# Patient Record
Sex: Male | Born: 1960 | Race: Black or African American | Hispanic: No | Marital: Married | Smoking: Current every day smoker
Health system: Southern US, Community
[De-identification: ages and names within clinical notes are randomized; demographics above are authoritative.]

## PROBLEM LIST (undated history)

## (undated) DIAGNOSIS — E7800 Pure hypercholesterolemia, unspecified: Secondary | ICD-10-CM

## (undated) DIAGNOSIS — M109 Gout, unspecified: Secondary | ICD-10-CM

## (undated) DIAGNOSIS — I10 Essential (primary) hypertension: Secondary | ICD-10-CM

---

## 1999-12-04 ENCOUNTER — Emergency Department (HOSPITAL_COMMUNITY): Admission: EM | Admit: 1999-12-04 | Discharge: 1999-12-04 | Payer: Self-pay | Admitting: Emergency Medicine

## 2005-03-09 ENCOUNTER — Emergency Department: Payer: Self-pay | Admitting: Emergency Medicine

## 2007-03-05 IMAGING — CR DG FOOT COMPLETE 3+V*L*
1 series · 3 of 3 positions shown · non-contrast
Comparison: none

REASON FOR EXAM: left foot pain
COMMENTS:  LMP: (Male)

[Series 1: view not recorded · 0.17mm/px · 3 of 3 slices shown]
[im 1/3]
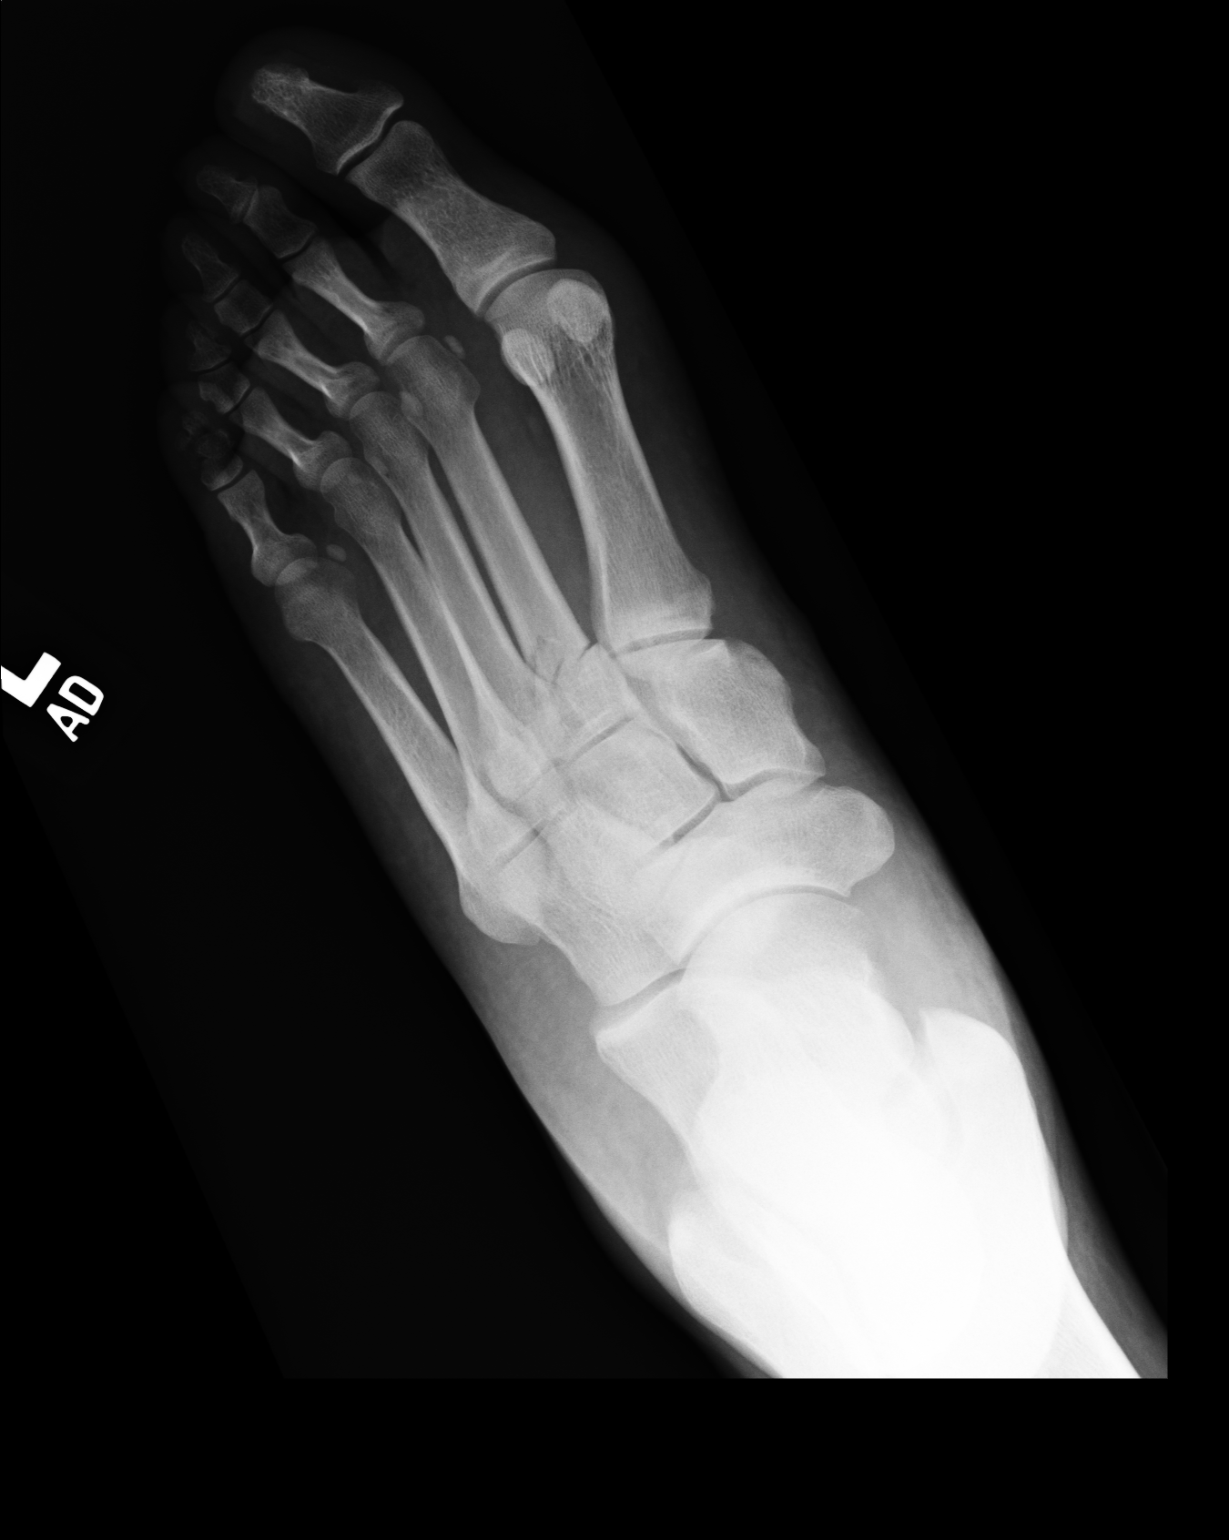
[im 2/3]
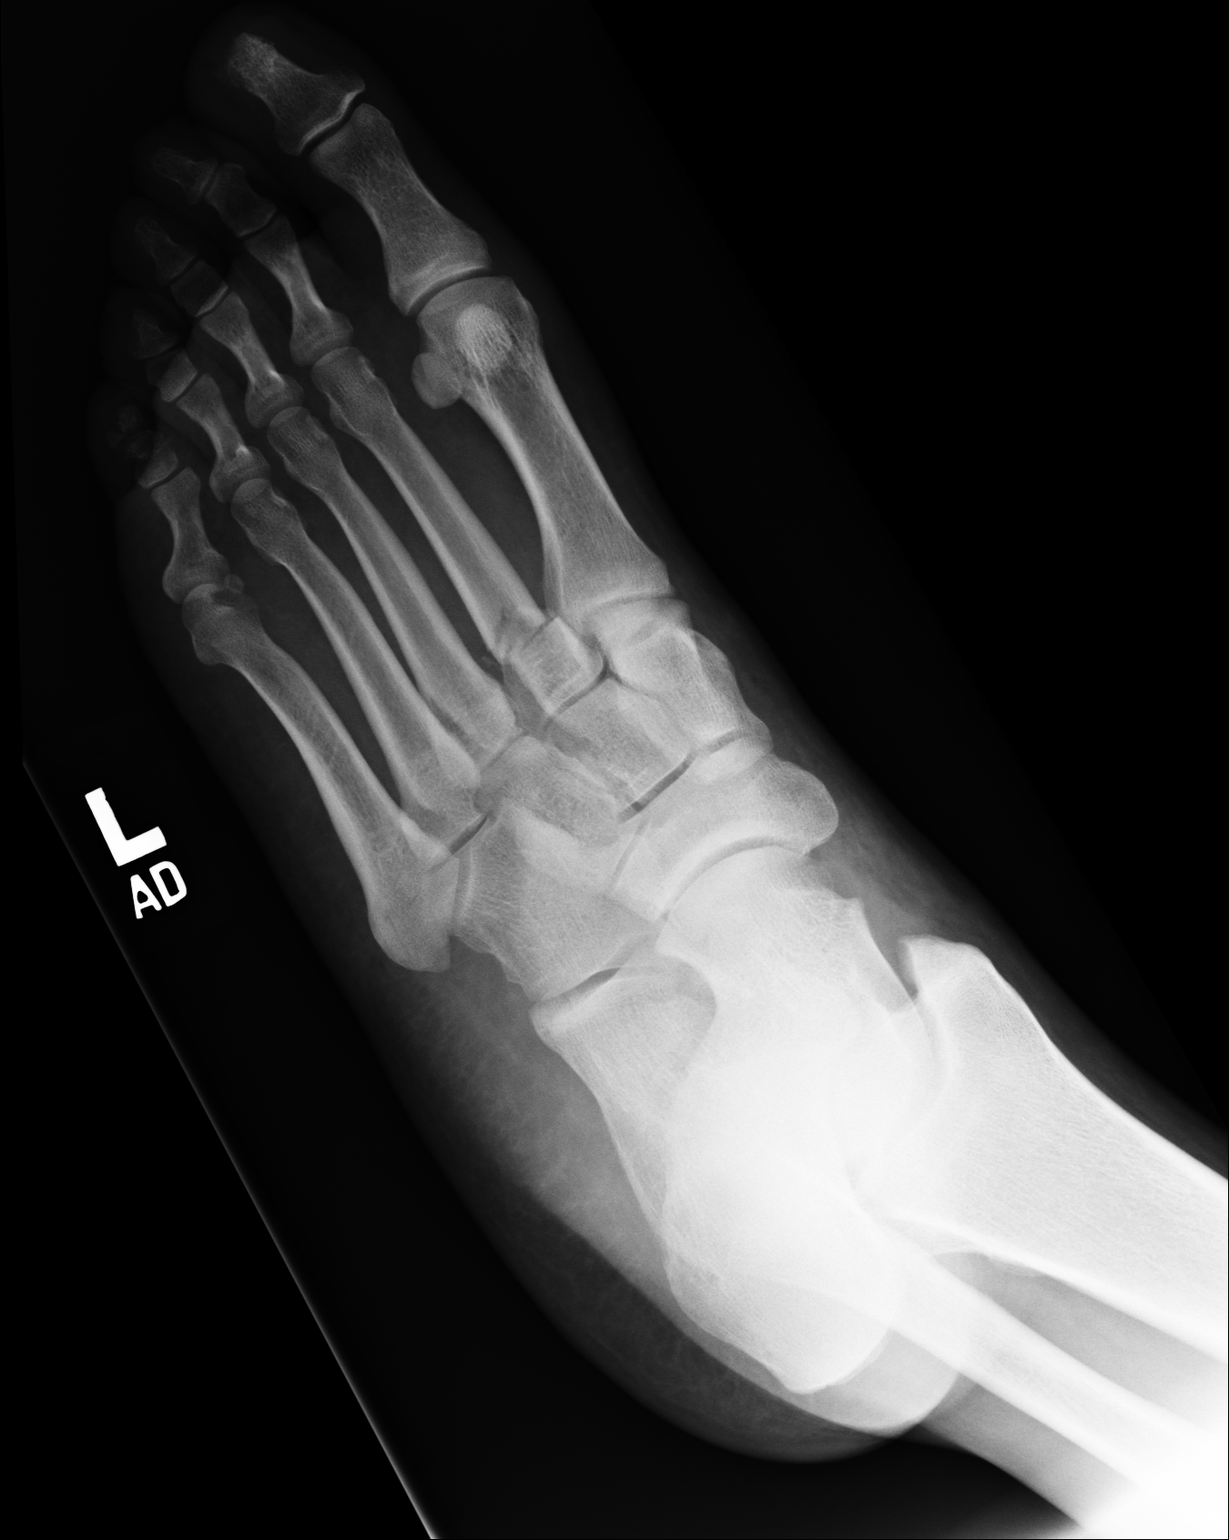
[im 3/3]
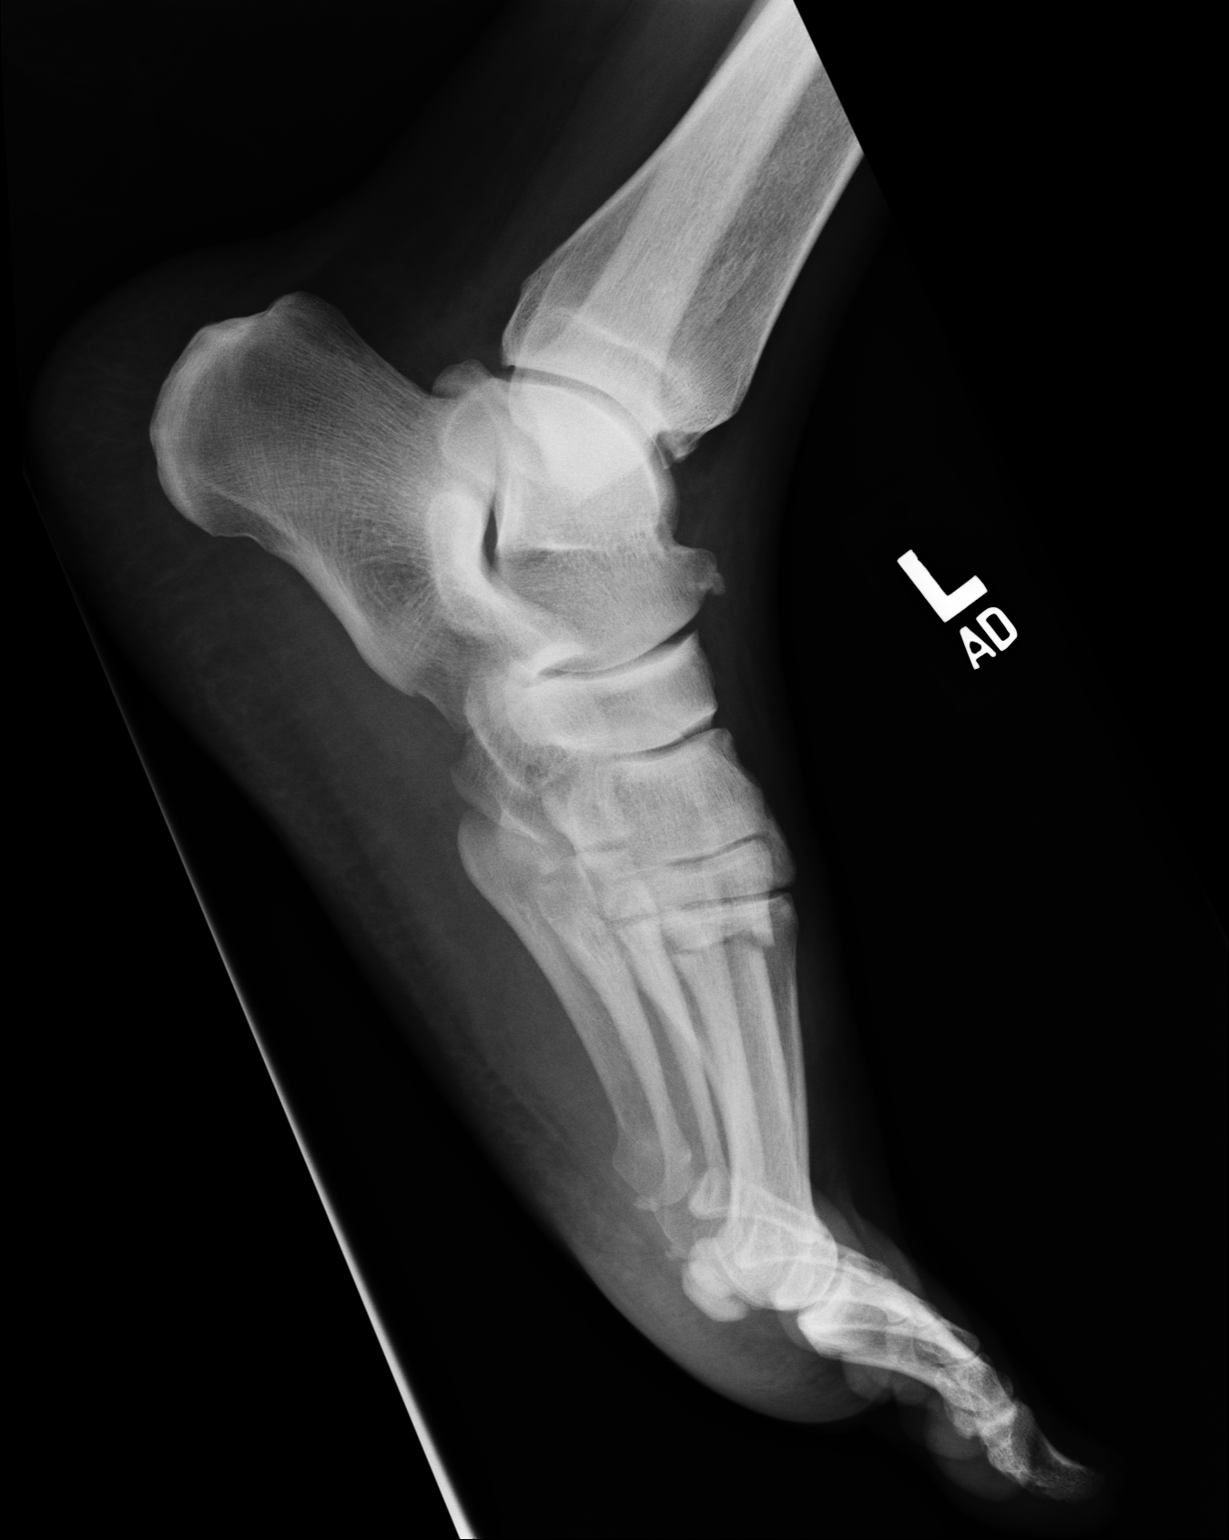

[3 of 3 positions shown; findings below may reference images not displayed]

PROCEDURE:     DXR - DXR FOOT LT COMP W/OBLIQUES  - March 09, 2005  [DATE]

RESULT:            There is a comminuted fracture at the base of the second
metatarsal.  There is also a fracture of the distal portion of the distal
phalanx of the LEFT fifth digit.  Soft tissue swelling is present about the
ankle.
IMPRESSION: 1.     Second metatarsal base fracture.
2.     Fifth distal phalanx fracture.

## 2007-03-05 IMAGING — CR RIGHT FOOT COMPLETE - 3+ VIEW
1 series · 3 of 3 positions shown · non-contrast
Comparison: none

REASON FOR EXAM: right foot pain
COMMENTS:  LMP: (Male)

PROCEDURE:     DXR - DXR FOOT RT COMPLETE W/OBLIQUES  - March 09, 2005  [DATE]
RESULT:       No acute bony or joint abnormality is identified.

[Series 1: view not recorded · 0.17mm/px · 3 of 3 slices shown]
[im 1/3]
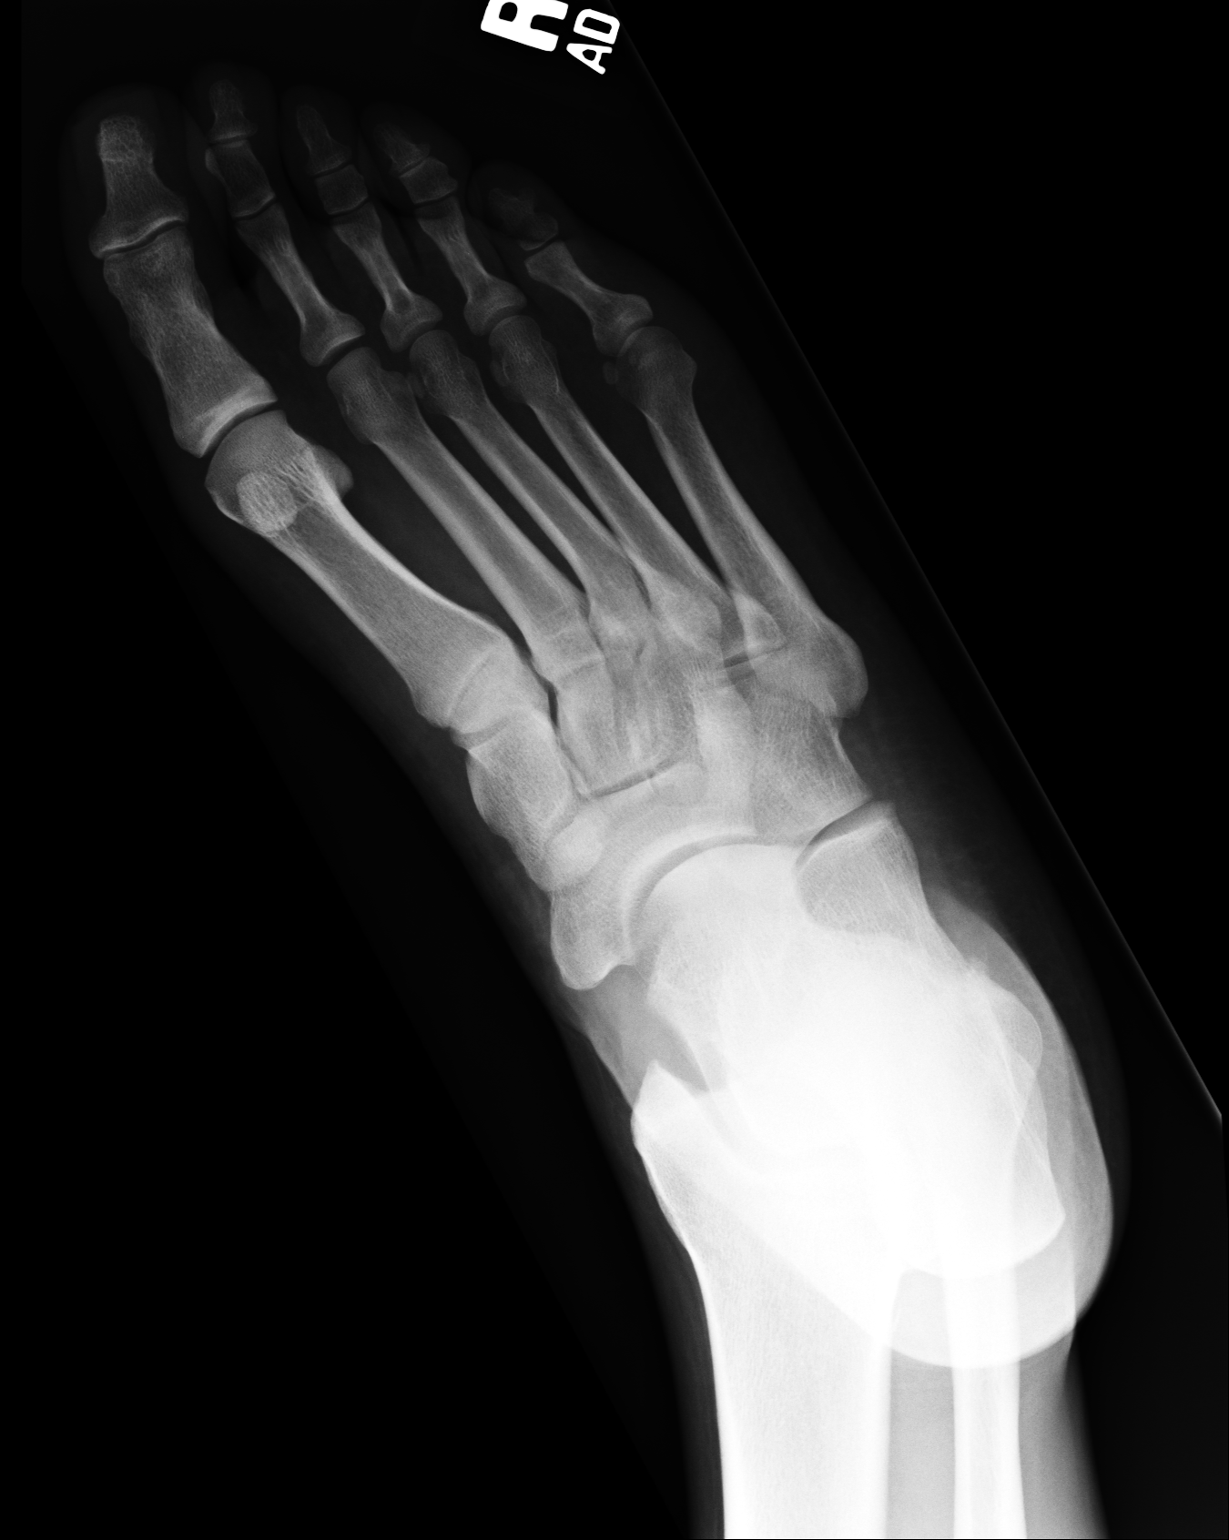
[im 2/3]
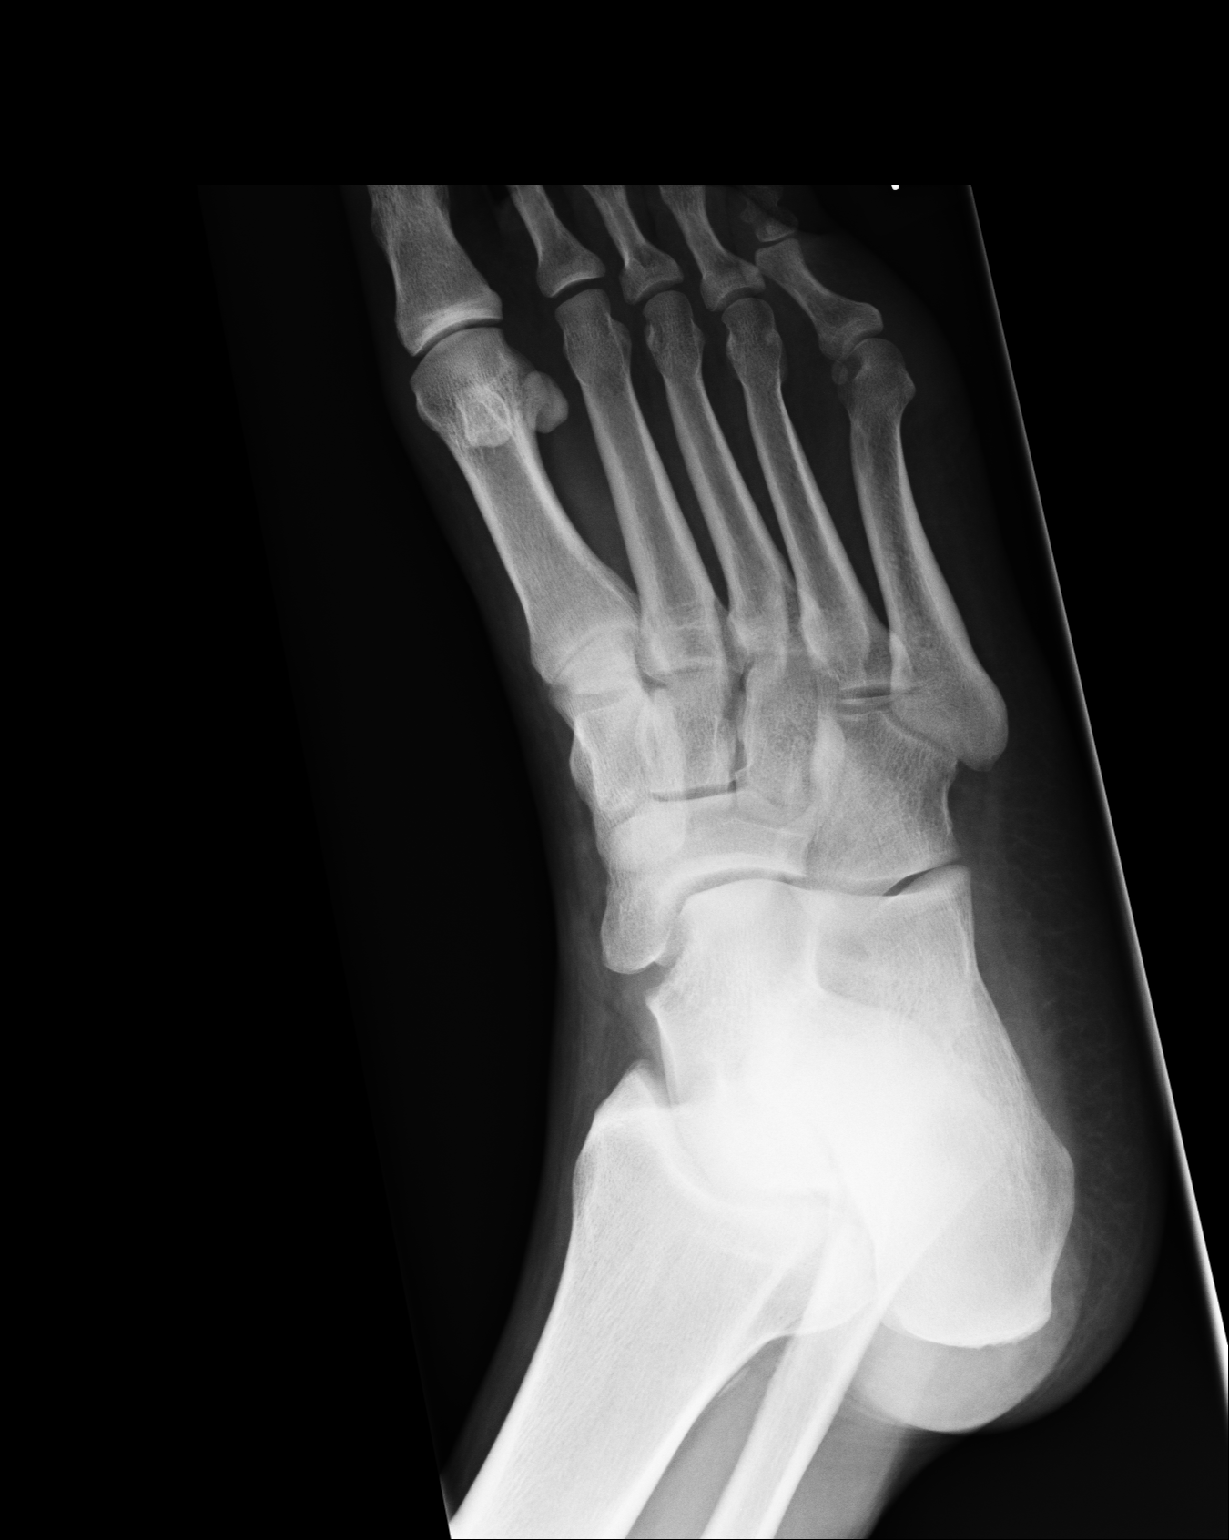
[im 3/3]
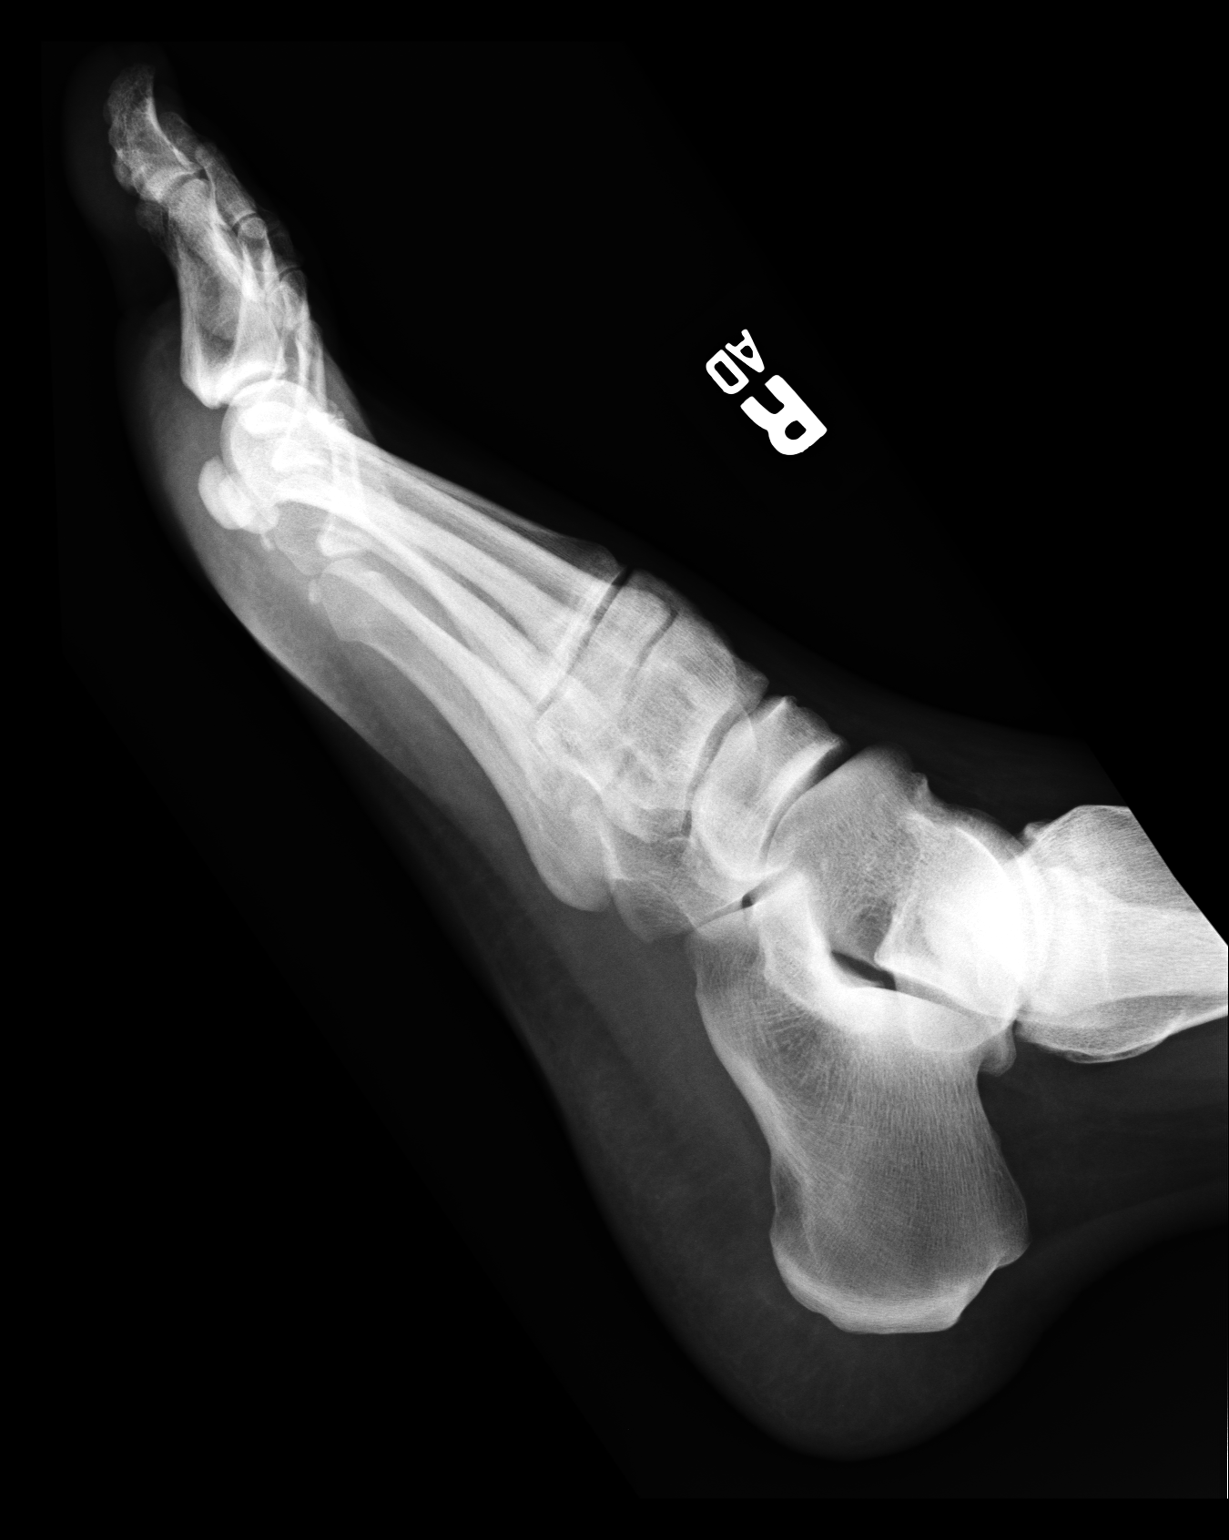

[3 of 3 positions shown; findings below may reference images not displayed]

IMPRESSION: See above.

## 2015-12-18 ENCOUNTER — Encounter: Payer: Self-pay | Admitting: Emergency Medicine

## 2015-12-18 ENCOUNTER — Emergency Department
Admission: EM | Admit: 2015-12-18 | Discharge: 2015-12-18 | Disposition: A | Discharge status: Home / Self Care | Payer: BLUE CROSS/BLUE SHIELD | Attending: Emergency Medicine | Admitting: Emergency Medicine

## 2015-12-18 DIAGNOSIS — E7800 Pure hypercholesterolemia, unspecified: Secondary | ICD-10-CM | POA: Diagnosis not present

## 2015-12-18 DIAGNOSIS — N289 Disorder of kidney and ureter, unspecified: Secondary | ICD-10-CM | POA: Diagnosis not present

## 2015-12-18 DIAGNOSIS — I10 Essential (primary) hypertension: Secondary | ICD-10-CM | POA: Diagnosis not present

## 2015-12-18 DIAGNOSIS — R55 Syncope and collapse: Secondary | ICD-10-CM | POA: Diagnosis present

## 2015-12-18 DIAGNOSIS — E860 Dehydration: Secondary | ICD-10-CM | POA: Insufficient documentation

## 2015-12-18 DIAGNOSIS — F17200 Nicotine dependence, unspecified, uncomplicated: Secondary | ICD-10-CM | POA: Diagnosis not present

## 2015-12-18 HISTORY — DX: Pure hypercholesterolemia, unspecified: E78.00

## 2015-12-18 HISTORY — DX: Essential (primary) hypertension: I10

## 2015-12-18 LAB — URINALYSIS COMPLETE WITH MICROSCOPIC (ARMC ONLY)
Bilirubin Urine: NEGATIVE
Glucose, UA: NEGATIVE mg/dL
Ketones, ur: NEGATIVE mg/dL
Leukocytes, UA: NEGATIVE
Nitrite: NEGATIVE
PROTEIN: 100 mg/dL — AB
SPECIFIC GRAVITY, URINE: 1.02 (ref 1.005–1.030)
pH: 5 (ref 5.0–8.0)

## 2015-12-18 LAB — BASIC METABOLIC PANEL
Anion gap: 10 (ref 5–15)
BUN: 27 mg/dL — AB (ref 6–20)
CALCIUM: 9.2 mg/dL (ref 8.9–10.3)
CO2: 25 mmol/L (ref 22–32)
CREATININE: 1.95 mg/dL — AB (ref 0.61–1.24)
Chloride: 101 mmol/L (ref 101–111)
GFR calc Af Amer: 43 mL/min — ABNORMAL LOW (ref >60)
GFR, EST NON AFRICAN AMERICAN: 37 mL/min — AB (ref >60)
Glucose, Bld: 74 mg/dL (ref 65–99)
Potassium: 3.8 mmol/L (ref 3.5–5.1)
SODIUM: 136 mmol/L (ref 135–145)

## 2015-12-18 LAB — CBC
HCT: 38 % — ABNORMAL LOW (ref 40.0–52.0)
Hemoglobin: 13 g/dL (ref 13.0–18.0)
MCH: 32.2 pg (ref 26.0–34.0)
MCHC: 34.2 g/dL (ref 32.0–36.0)
MCV: 94.3 fL (ref 80.0–100.0)
PLATELETS: 334 10*3/uL (ref 150–440)
RBC: 4.03 MIL/uL — ABNORMAL LOW (ref 4.40–5.90)
RDW: 13.9 % (ref 11.5–14.5)
WBC: 9.1 10*3/uL (ref 3.8–10.6)

## 2015-12-18 LAB — TROPONIN I: TROPONIN I: 0.03 ng/mL (ref <0.031)

## 2015-12-18 MED ORDER — ONDANSETRON HCL 4 MG/2ML IJ SOLN
4.0000 mg | Freq: Once | INTRAMUSCULAR | Status: AC
  Administered 2015-12-18 (×2): 4 mg via INTRAVENOUS

## 2015-12-18 MED ORDER — SODIUM CHLORIDE 0.9 % IV SOLN
Freq: Once | INTRAVENOUS | Status: AC
  Administered 2015-12-18: 21:00:00 via INTRAVENOUS

## 2015-12-18 MED ORDER — ONDANSETRON 4 MG PO TBDP: 4.0000 mg | ORAL_TABLET | Freq: Four times a day (QID) | ORAL | Status: DC | PRN

## 2015-12-18 MED ORDER — ONDANSETRON HCL 4 MG/2ML IJ SOLN
INTRAMUSCULAR | Status: AC
  Administered 2015-12-18: 4 mg via INTRAVENOUS
  Filled 2015-12-18: qty 2

## 2015-12-18 NOTE — ED Provider Notes (Signed)
[facility] Emergency Department Provider Note  ____________________________________________  Time seen: Approximately 8:00 PM  I have reviewed the triage vital signs and the nursing notes.   HISTORY  Chief Complaint Near Syncope    HPI Jon Osborne is a 55 y.o. male with history of history of hypertension.  The patient reports that this morning he woke up feeling lightheaded and when he is standing up he felt like he could pass out. The symptoms have persisted throughout the day today. He reports he thinks this is a lady with a party he had last night. He drank a large amount of alcohol and thinks he got "dehydrated".  He denies being in any pain. Feels just generally kind of weak and tired. He reports he feels like he may have a "hangover". No chest pain or trouble breathing. No fevers chills, just reports a very mild headache that is worse with standing.  No abdominal pain. Some mild nausea this morning which seems to be better. He states he hasn't had a lot to eat today.  No weakness in one arm or leg, no severe headache, no neck pain, no numbness or tingling.  He never has actually passed out. Just states he feels lightheaded especially with standing and walking.   Past Medical History  Diagnosis Date  . Hypertension   . Hypercholesteremia     There are no active problems to display for this patient.   History reviewed. No pertinent past surgical history.  Current Outpatient Rx  Name  Route  Sig  Dispense  Refill  . ondansetron (ZOFRAN ODT) 4 MG disintegrating tablet   Oral   Take 1 tablet (4 mg total) by mouth every 6 (six) hours as needed for nausea or vomiting.   20 tablet   0     Allergies Review of patient's allergies indicates no known allergies.  History reviewed. No pertinent family history.  Social History Social History  Substance Use Topics  . Smoking status: Current Every Day Smoker  . Smokeless tobacco: None  .  Alcohol Use: Yes    Review of Systems Constitutional: No fever/chills Eyes: No visual changes. ENT: No sore throat. Cardiovascular: Denies chest pain. Respiratory: Denies shortness of breath. Gastrointestinal: No abdominal pain. No vomiting.  No diarrhea.  No constipation. Genitourinary: Negative for dysuria. Musculoskeletal: Negative for back pain. Skin: Negative for rash. Neurological: Negative for trouble speaking, focal weakness or numbness. No facial droop. No weakness in one arm or leg.  10-point ROS otherwise negative.  ____________________________________________   PHYSICAL EXAM:  VITAL SIGNS: ED Triage Vitals  Enc Vitals Group     BP 12/18/15 1751 112/64 mmHg     Pulse Rate 12/18/15 1751 79     Resp 12/18/15 1751 18     Temp 12/18/15 1751 98.7 F (37.1 C)     Temp Source 12/18/15 1751 Oral     SpO2 12/18/15 1751 95 %     Weight 12/18/15 1751 185 lb (83.915 kg)     Height 12/18/15 1751 '6\' 1"'$  (1.854 m)     Head Cir --      Peak Flow --      Pain Score 12/18/15 1954 0     Pain Loc --      Pain Edu? --      Excl. in [place]? --    Constitutional: Alert and oriented. Well appearing and in no acute distress. Eyes: Conjunctivae are normal. PERRL. EOMI. Head: Atraumatic. No photophobia. Nose: No  congestion/rhinnorhea. Mouth/Throat: Mucous membranes are slightly dry.  Oropharynx non-erythematous. Neck: No stridor.  No meningismus. Cardiovascular: Normal rate, regular rhythm. Grossly normal heart sounds.  Good peripheral circulation. Respiratory: Normal respiratory effort.  No retractions. Lungs CTAB. Gastrointestinal: Soft and nontender. No distention.  Musculoskeletal: No lower extremity tenderness nor edema.  No joint effusions. Neurologic:  Normal speech and language. No gross focal neurologic deficits are appreciated. No pronator drift. Normal cranial nerves. Normal finger-nose-finger. Normal extraocular movements. 5 out of 5 strength in all extremities with normal  sensation. No gait instability. Skin:  Skin is warm, dry and intact. No rash noted. Psychiatric: Mood and affect are normal. Speech and behavior are normal.  ____________________________________________   LABS (all labs ordered are listed, but only abnormal results are displayed)  Labs Reviewed  BASIC METABOLIC PANEL - Abnormal; Notable for the following:    BUN 27 (*)    Creatinine, Ser 1.95 (*)    GFR calc non Af Amer 37 (*)    GFR calc Af Amer 43 (*)    All other components within normal limits  CBC - Abnormal; Notable for the following:    RBC 4.03 (*)    HCT 38.0 (*)    All other components within normal limits  URINALYSIS COMPLETEWITH MICROSCOPIC ([facility] ONLY) - Abnormal; Notable for the following:    Color, Urine YELLOW (*)    APPearance CLOUDY (*)    Hgb urine dipstick 1+ (*)    Protein, ur 100 (*)    Bacteria, UA RARE (*)    Squamous Epithelial / LPF 0-5 (*)    All other components within normal limits  TROPONIN I   ____________________________________________  EKG  Reviewed and interpreted by me at 1800 Ventricular rate at 75 PR 140 QRS 90 QTc 450 T waves with slight nonspecific slurring noted, the QT appears to be normal. No evidence of ischemic abnormality noted. ____________________________________________  RADIOLOGY   ____________________________________________   PROCEDURES  Procedure(s) performed: None  Critical Care performed: No  ____________________________________________   INITIAL IMPRESSION / ASSESSMENT AND PLAN / ED COURSE  Pertinent labs & imaging results that were available during my care of the patient were reviewed by me and considered in my medical decision making (see chart for details).  Patient presents for generalized fatigue and lightheadedness and feeling dehydrated. History seems to correlate clinically with drinking a fair amount of alcohol last night likely dehydration. He has not had a syncopal episode. No acute  cardiopulmonary symptoms. No fevers chills. Vital signs stable.  History seems to suggest dehydration, lab work seems to suggest the same with mild renal insufficiency, GFR 43. He is hydrated here in the ER, he is taking fluid well and reports symptoms are much better after receiving a liter of fluid. I discussed with him and encourage good hydration, will provide a prescription for Zofran and he'll follow-up with his doctor in [place].  Return precautions and treatment recommendations and follow-up discussed with the patient who is agreeable with the plan.  Normal troponin. No pulmonary or cardiac symptoms. No abdominal pain. Normal neurologic exam.  Patient ambulatory, states he feels much better walking about no distress eating and drinking well in the ER at the time of discharge.  Negative SF syncope rule. ____________________________________________   FINAL CLINICAL IMPRESSION(S) / ED DIAGNOSES  Final diagnoses:  Dehydration, moderate  Near syncope  Mild renal insufficiency      Delman Kitten, MD 12/18/15 2139

## 2015-12-18 NOTE — ED Notes (Signed)
Pt reports got dizzy and lightheaded today and thought he was going to pass out.  Has passed out once before. Denies any pain. NAD

## 2015-12-18 NOTE — Discharge Instructions (Signed)
You have been seen today in the Emergency Department (ED)  for feeling weak and being dehydrated.  Your workup including labs and EKG show reassuring results.  Your symptoms may be due to dehydration, so it is important that you drink plenty of non-alcoholic fluids.  Please call your regular doctor as soon as possible to schedule the next available clinic appointment to follow up with him/her regarding your visit to the ED and your symptoms.  Return to the Emergency Department (ED)  if you have any further syncopal episodes (pass out again) or develop ANY chest pain, pressure, tightness, trouble breathing, sudden sweating, or other symptoms that concern you.   Dehydration, Adult Dehydration is a condition in which you do not have enough fluid or water in your body. It happens when you take in less fluid than you lose. Vital organs such as the kidneys, brain, and heart cannot function without a proper amount of fluids. Any loss of fluids from the body can cause dehydration.  Dehydration can range from mild to severe. This condition should be treated right away to help prevent it from becoming severe. CAUSES  This condition may be caused by:  Vomiting.  Diarrhea.  Excessive sweating, such as when exercising in hot or humid weather.  Not drinking enough fluid during strenuous exercise or during an illness.  Excessive urine output.  Fever.  Certain medicines. RISK FACTORS This condition is more likely to develop in:  People who are taking certain medicines that cause the body to lose excess fluid (diuretics).   People who have a chronic illness, such as diabetes, that may increase urination.  Older adults.   People who live at high altitudes.   People who participate in endurance sports.  SYMPTOMS  Mild Dehydration  Thirst.  Dry lips.  Slightly dry mouth.  Dry, warm skin. Moderate Dehydration  Very dry mouth.   Muscle cramps.   Dark urine and decreased urine  production.   Decreased tear production.   Headache.   Light-headedness, especially when you stand up from a sitting position.  Severe Dehydration  Changes in skin.   Cold and clammy skin.   Skin does not spring back quickly when lightly pinched and released.   Changes in body fluids.   Extreme thirst.   No tears.   Not able to sweat when body temperature is high, such as in hot weather.   Minimal urine production.   Changes in vital signs.   Rapid, weak pulse (more than 100 beats per minute when you are sitting still).   Rapid breathing.   Low blood pressure.   Other changes.   Sunken eyes.   Cold hands and feet.   Confusion.  Lethargy and difficulty being awakened.  Fainting (syncope).   Short-term weight loss.   Unconsciousness. DIAGNOSIS  This condition may be diagnosed based on your symptoms. You may also have tests to determine how severe your dehydration is. These tests may include:   Urine tests.   Blood tests.  TREATMENT  Treatment for this condition depends on the severity. Mild or moderate dehydration can often be treated at home. Treatment should be started right away. Do not wait until dehydration becomes severe. Severe dehydration needs to be treated at the hospital. Treatment for Mild Dehydration  Drinking plenty of water to replace the fluid you have lost.   Replacing minerals in your blood (electrolytes) that you may have lost.  Treatment for Moderate Dehydration  Consuming oral rehydration solution (ORS). Treatment  for Severe Dehydration  Receiving fluid through an IV tube.   Receiving electrolyte solution through a feeding tube that is passed through your nose and into your stomach (nasogastric tube or NG tube).  Correcting any abnormalities in electrolytes. HOME CARE INSTRUCTIONS   Drink enough fluid to keep your urine clear or pale yellow.   Drink water or fluid slowly by taking small sips. You  can also try sucking on ice cubes.  Have food or beverages that contain electrolytes. Examples include bananas and sports drinks.  Take over-the-counter and prescription medicines only as told by your health care provider.   Prepare ORS according to the manufacturer's instructions. Take sips of ORS every 5 minutes until your urine returns to normal.  If you have vomiting or diarrhea, continue to try to drink water, ORS, or both.   If you have diarrhea, avoid:   Beverages that contain caffeine.   Fruit juice.   Milk.   Carbonated soft drinks.  Do not take salt tablets. This can lead to the condition of having too much sodium in your body (hypernatremia).  SEEK MEDICAL CARE IF:  You cannot eat or drink without vomiting.  You have had moderate diarrhea during a period of more than 24 hours.  You have a fever. SEEK IMMEDIATE MEDICAL CARE IF:   You have extreme thirst.  You have severe diarrhea.  You have not urinated in 6-8 hours, or you have urinated only a small amount of very dark urine.  You have shriveled skin.  You are dizzy, confused, or both.   This information is not intended to replace advice given to you by your health care provider. Make sure you discuss any questions you have with your health care provider.   Document Released: 09/03/2005 Document Revised: 05/25/2015 Document Reviewed: 01/19/2015 Elsevier Interactive Patient Education [employer].

## 2015-12-18 NOTE — ED Notes (Signed)
Pt ambulated down hallway WAD, denies any dizziness, states he feels better

## 2017-10-22 ENCOUNTER — Other Ambulatory Visit (HOSPITAL_COMMUNITY): Payer: Self-pay | Admitting: Emergency Medicine

## 2017-10-22 DIAGNOSIS — R911 Solitary pulmonary nodule: Secondary | ICD-10-CM

## 2017-10-30 ENCOUNTER — Ambulatory Visit (HOSPITAL_COMMUNITY)
Admission: RE | Admit: 2017-10-30 | Discharge: 2017-10-30 | Disposition: A | Discharge status: Home / Self Care | Payer: BLUE CROSS/BLUE SHIELD | Source: Ambulatory Visit | Attending: Emergency Medicine | Admitting: Emergency Medicine

## 2017-10-30 DIAGNOSIS — I700 Atherosclerosis of aorta: Secondary | ICD-10-CM | POA: Diagnosis not present

## 2017-10-30 DIAGNOSIS — R911 Solitary pulmonary nodule: Secondary | ICD-10-CM

## 2017-10-30 DIAGNOSIS — I2510 Atherosclerotic heart disease of native coronary artery without angina pectoris: Secondary | ICD-10-CM | POA: Insufficient documentation

## 2017-10-30 MED ORDER — IOPAMIDOL (ISOVUE-300) INJECTION 61%
75.0000 mL | Freq: Once | INTRAVENOUS | Status: AC | PRN
  Administered 2017-10-30: 75 mL via INTRAVENOUS

## 2017-11-07 ENCOUNTER — Encounter (INDEPENDENT_AMBULATORY_CARE_PROVIDER_SITE_OTHER): Payer: Self-pay | Admitting: *Deleted

## 2018-02-07 ENCOUNTER — Other Ambulatory Visit (HOSPITAL_COMMUNITY): Payer: Self-pay | Admitting: Pulmonary Disease

## 2018-02-07 DIAGNOSIS — R911 Solitary pulmonary nodule: Secondary | ICD-10-CM

## 2018-02-18 ENCOUNTER — Ambulatory Visit (HOSPITAL_COMMUNITY)
Admission: RE | Admit: 2018-02-18 | Discharge: 2018-02-18 | Disposition: A | Discharge status: Home / Self Care | Payer: BLUE CROSS/BLUE SHIELD | Source: Ambulatory Visit | Attending: Pulmonary Disease | Admitting: Pulmonary Disease

## 2018-02-18 DIAGNOSIS — R918 Other nonspecific abnormal finding of lung field: Secondary | ICD-10-CM | POA: Diagnosis not present

## 2018-02-18 DIAGNOSIS — I2510 Atherosclerotic heart disease of native coronary artery without angina pectoris: Secondary | ICD-10-CM | POA: Diagnosis not present

## 2018-02-18 DIAGNOSIS — R911 Solitary pulmonary nodule: Secondary | ICD-10-CM | POA: Insufficient documentation

## 2018-02-21 ENCOUNTER — Other Ambulatory Visit (HOSPITAL_COMMUNITY): Payer: Self-pay | Admitting: Respiratory Therapy

## 2018-02-21 DIAGNOSIS — R0602 Shortness of breath: Secondary | ICD-10-CM

## 2018-02-27 ENCOUNTER — Ambulatory Visit (HOSPITAL_COMMUNITY)
Admission: RE | Admit: 2018-02-27 | Discharge: 2018-02-27 | Disposition: A | Discharge status: Home / Self Care | Payer: BLUE CROSS/BLUE SHIELD | Source: Ambulatory Visit | Attending: Pulmonary Disease | Admitting: Pulmonary Disease

## 2018-02-27 DIAGNOSIS — R0602 Shortness of breath: Secondary | ICD-10-CM | POA: Diagnosis not present

## 2018-02-27 LAB — PULMONARY FUNCTION TEST
DL/VA % PRED: 97 %
DL/VA: 4.63 ml/min/mmHg/L
DLCO UNC: 25.57 ml/min/mmHg
DLCO unc % pred: 72 %
FEF 25-75 POST: 2.57 L/s
FEF 25-75 Pre: 1.26 L/sec
FEF2575-%Change-Post: 104 %
FEF2575-%Pred-Post: 79 %
FEF2575-%Pred-Pre: 38 %
FEV1-%CHANGE-POST: 31 %
FEV1-%PRED-PRE: 58 %
FEV1-%Pred-Post: 76 %
FEV1-Post: 2.64 L
FEV1-Pre: 2 L
FEV1FVC-%Change-Post: 6 %
FEV1FVC-%PRED-PRE: 79 %
FEV6-%Change-Post: 22 %
FEV6-%PRED-POST: 91 %
FEV6-%Pred-Pre: 74 %
FEV6-Post: 3.89 L
FEV6-Pre: 3.17 L
FEV6FVC-%CHANGE-POST: -1 %
FEV6FVC-%Pred-Post: 101 %
FEV6FVC-%Pred-Pre: 102 %
FVC-%Change-Post: 23 %
FVC-%PRED-POST: 90 %
FVC-%PRED-PRE: 73 %
FVC-PRE: 3.2 L
FVC-Post: 3.96 L
POST FEV1/FVC RATIO: 67 %
PRE FEV6/FVC RATIO: 100 %
Post FEV6/FVC ratio: 98 %
Pre FEV1/FVC ratio: 63 %
RV % pred: 157 %
RV: 3.6 L
TLC % PRED: 92 %
TLC: 6.83 L

## 2018-02-27 MED ORDER — ALBUTEROL SULFATE (2.5 MG/3ML) 0.083% IN NEBU
2.5000 mg | INHALATION_SOLUTION | Freq: Once | RESPIRATORY_TRACT | Status: AC
  Administered 2018-02-27: 2.5 mg via RESPIRATORY_TRACT

## 2018-03-22 ENCOUNTER — Other Ambulatory Visit: Payer: Self-pay

## 2018-03-22 ENCOUNTER — Emergency Department (HOSPITAL_COMMUNITY)
Admission: EM | Admit: 2018-03-22 | Discharge: 2018-03-23 | Disposition: A | Discharge status: Home / Self Care | Payer: BLUE CROSS/BLUE SHIELD | Attending: Emergency Medicine | Admitting: Emergency Medicine

## 2018-03-22 ENCOUNTER — Encounter (HOSPITAL_COMMUNITY): Payer: Self-pay | Admitting: Emergency Medicine

## 2018-03-22 DIAGNOSIS — Z79899 Other long term (current) drug therapy: Secondary | ICD-10-CM | POA: Insufficient documentation

## 2018-03-22 DIAGNOSIS — I10 Essential (primary) hypertension: Secondary | ICD-10-CM | POA: Insufficient documentation

## 2018-03-22 DIAGNOSIS — W540XXA Bitten by dog, initial encounter: Secondary | ICD-10-CM | POA: Diagnosis not present

## 2018-03-22 DIAGNOSIS — Y999 Unspecified external cause status: Secondary | ICD-10-CM | POA: Diagnosis not present

## 2018-03-22 DIAGNOSIS — Z23 Encounter for immunization: Secondary | ICD-10-CM | POA: Diagnosis not present

## 2018-03-22 DIAGNOSIS — Y92007 Garden or yard of unspecified non-institutional (private) residence as the place of occurrence of the external cause: Secondary | ICD-10-CM | POA: Diagnosis not present

## 2018-03-22 DIAGNOSIS — Y939 Activity, unspecified: Secondary | ICD-10-CM | POA: Diagnosis not present

## 2018-03-22 DIAGNOSIS — S81852A Open bite, left lower leg, initial encounter: Secondary | ICD-10-CM | POA: Diagnosis present

## 2018-03-22 NOTE — ED Triage Notes (Signed)
Pt states he was bit in the left leg by his "partners dog." Pt states the dog does not have his rabies vaccine up to date.

## 2018-03-22 NOTE — ED Notes (Signed)
c-comm reports that the dog bite has already been reported,

## 2018-03-23 ENCOUNTER — Emergency Department (HOSPITAL_COMMUNITY): Payer: BLUE CROSS/BLUE SHIELD

## 2018-03-23 MED ORDER — HYDROCODONE-ACETAMINOPHEN 5-325 MG PO TABS: 1.0000 | ORAL_TABLET | ORAL | 0 refills | Status: DC | PRN

## 2018-03-23 MED ORDER — AMOXICILLIN-POT CLAVULANATE 875-125 MG PO TABS
1.0000 | ORAL_TABLET | Freq: Once | ORAL | Status: AC
  Administered 2018-03-23: 1 via ORAL
  Filled 2018-03-23: qty 1

## 2018-03-23 MED ORDER — AMOXICILLIN-POT CLAVULANATE 875-125 MG PO TABS: 1.0000 | ORAL_TABLET | Freq: Two times a day (BID) | ORAL | 0 refills | Status: DC

## 2018-03-23 MED ORDER — HYDROCODONE-ACETAMINOPHEN 5-325 MG PO TABS
1.0000 | ORAL_TABLET | Freq: Once | ORAL | Status: AC
  Administered 2018-03-23: 1 via ORAL
  Filled 2018-03-23: qty 1

## 2018-03-23 MED ORDER — TETANUS-DIPHTH-ACELL PERTUSSIS 5-2.5-18.5 LF-MCG/0.5 IM SUSP
0.5000 mL | Freq: Once | INTRAMUSCULAR | Status: AC
  Administered 2018-03-23: 0.5 mL via INTRAMUSCULAR
  Filled 2018-03-23: qty 0.5

## 2018-03-23 NOTE — ED Provider Notes (Signed)
[facility] EMERGENCY DEPARTMENT Provider Note   CSN: [id number] Arrival date & time: 03/22/18  2331     History   Chief Complaint Chief Complaint  Patient presents with  . Animal Bite    HPI Jon Osborne is a 57 y.o. male.  The history is provided by the patient, the spouse and a relative.  Animal Bite  Contact animal:  Dog Location:  Leg Leg injury location:  L lower leg Time since incident:  2 hours Pain details:    Quality:  Aching and stinging   Severity:  Moderate   Timing:  Constant   Progression:  Unchanged Incident location:  Home (occured in patients front yard.  the dog involved is a neighbors dog whom the patient knows well. He reports this is an elderly dog whom he may have accidentally stepped on is tail, triggering the incident.) Provoked: provoked (Pt states the dog is usually friendly. )   Notifications:  Animal control Animal's rabies vaccination status:  Not immunized Animal in possession: yes   Tetanus status:  Unknown Relieved by:  Cold compresses Ineffective treatments:  None tried Associated symptoms: swelling   Associated symptoms: no fever and no numbness     Past Medical History:  Diagnosis Date  . Hypercholesteremia   . Hypertension     There are no active problems to display for this patient.   History reviewed. No pertinent surgical history.      Home Medications    Prior to Admission medications   Medication Sig Start Date End Date Taking? Authorizing Provider  lisinopril (PRINIVIL,ZESTRIL) 40 MG tablet Take 40 mg by mouth daily.   Yes [provider]  pravastatin (PRAVACHOL) 40 MG tablet Take 40 mg by mouth daily.   Yes [provider]  amoxicillin-clavulanate (AUGMENTIN) 875-125 MG tablet Take 1 tablet by mouth every 12 (twelve) hours. 03/23/18   Evalee Jefferson, PA-C  HYDROcodone-acetaminophen (NORCO/VICODIN) 5-325 MG tablet Take 1 tablet by mouth every 4 (four) hours as needed. 03/23/18   Evalee Jefferson, PA-C    ondansetron (ZOFRAN ODT) 4 MG disintegrating tablet Take 1 tablet (4 mg total) by mouth every 6 (six) hours as needed for nausea or vomiting. 12/18/15   Delman Kitten, MD    Family History No family history on file.  Social History Social History   Tobacco Use  . Smoking status: Current Every Day Smoker  . Smokeless tobacco: Never Used  Substance Use Topics  . Alcohol use: Yes  . Drug use: No     Allergies   Patient has no known allergies.   Review of Systems Review of Systems  Constitutional: Negative for chills and fever.  Respiratory: Negative.   Cardiovascular: Negative.   Gastrointestinal: Negative.   Musculoskeletal: Negative for arthralgias.  Skin: Positive for wound.  Neurological: Negative for weakness and numbness.     Physical Exam Updated Vital Signs BP (!) 156/108 (BP Location: Left Arm)   Pulse 68   Temp 98.6 F (37 C) (Oral)   Resp 18   Wt 83.9 kg (185 lb)   SpO2 94%   BMI 24.41 kg/m   Physical Exam  Constitutional: He appears well-developed and well-nourished.  HENT:  Head: Normocephalic and atraumatic.  Neck: Normal range of motion.  Cardiovascular: Normal rate, regular rhythm and intact distal pulses.  Pulmonary/Chest: Effort normal and breath sounds normal. He has no wheezes.  Musculoskeletal: Normal range of motion. He exhibits edema and tenderness.  Moderate tender induration noted around site of  puncture wound x1 left lateral upper lateral calf. Skin otherwise intact.  Hemostatic.  Neurological: He is alert.  Skin: Skin is warm and dry.  Psychiatric: He has a normal mood and affect.  Nursing note and vitals reviewed.    ED Treatments / Results  Labs (all labs ordered are listed, but only abnormal results are displayed) Labs Reviewed - No data to display  EKG None  Radiology Dg Tibia/fibula Left  Result Date: 03/23/2018 CLINICAL DATA:  Initial evaluation for acute trauma, dog bite. EXAM: LEFT TIBIA AND FIBULA - 2 VIEW  COMPARISON:  None. FINDINGS: No acute fracture or dislocation. Soft tissue irregularity with emphysema within the lateral aspect of the proximal left leg, likely related dog bite. No radiopaque foreign body. Degenerative changes noted about the knee. IMPRESSION: 1. Soft tissue irregularity with emphysema at the lateral aspect of the proximal left leg, likely related to history of dog bite. No radiopaque foreign body. 2. No acute osseous abnormality. Electronically Signed   By: Jeannine Boga M.D.   On: 03/23/2018 01:28    Procedures Procedures (including critical care time)  Puncture wound was given wound care including betadine scrub followed by NS flush using 250 cc of NS with syringe with jet irrigation.  Dressing applied by RN.   Medications Ordered in ED Medications  Tdap (BOOSTRIX) injection 0.5 mL (0.5 mLs Intramuscular Given 03/23/18 0039)  HYDROcodone-acetaminophen (NORCO/VICODIN) 5-325 MG per tablet 1 tablet (1 tablet Oral Given 03/23/18 0039)  amoxicillin-clavulanate (AUGMENTIN) 875-125 MG per tablet 1 tablet (1 tablet Oral Given 03/23/18 0205)     Initial Impression / Assessment and Plan / ED Course  I have reviewed the triage vital signs and the nursing notes.  Pertinent labs & imaging results that were available during my care of the patient were reviewed by me and considered in my medical decision making (see chart for details).     Imaging reviewed and negative for bony injury or retained fb. Pt started on augmenting. Discussed need to start rabies vaccines if animal control recommends within the next 10 days. They have contacted animal control (in [place]). Pt aware to return here for this if animal control directs. His tetanus was updated, started on augmentin. Wound care instructions given, strict return precautions outlined.  Final Clinical Impressions(s) / ED Diagnoses   Final diagnoses:  Dog bite, initial encounter    ED Discharge Orders        Ordered     amoxicillin-clavulanate (AUGMENTIN) 875-125 MG tablet  Every 12 hours     03/23/18 0200    HYDROcodone-acetaminophen (NORCO/VICODIN) 5-325 MG tablet  Every 4 hours PRN     03/23/18 0200       Evalee Jefferson, PA-C 03/23/18 [place], [place], DO 03/26/18 1527

## 2018-03-23 NOTE — Discharge Instructions (Signed)
As discussed, you have 10 days to safely start getting the rabies vaccine series if Animal Control cannot confirm that the dog that bit you is healthy and free of rabies. Ice and elevate your leg as much as possible for the next 2 days to help minimize and further swelling. Starting on Tuesday you should also start applying a heating pad for 20 minutes several times daily.  If you have to start the rabies series you will need to return here for these, unless they are available in [facility].  Animal Control should be able to guide you with this.   Take your entire course of the antibiotics to help prevent infection in your wound.  You may take the hydrocodone prescribed for pain relief.  This will make you drowsy - do not drive within 4 hours of taking this medication.

## 2019-10-09 MED ORDER — THIAMINE HCL 100 MG PO TABS
100.00 | ORAL_TABLET | ORAL | Status: DC
Start: 2019-10-10 — End: 2019-10-09

## 2019-10-09 MED ORDER — NICOTINE 14 MG/24HR TD PT24
1.00 | MEDICATED_PATCH | TRANSDERMAL | Status: DC
Start: 2019-10-10 — End: 2019-10-09

## 2019-10-09 MED ORDER — ENOXAPARIN SODIUM 40 MG/0.4ML ~~LOC~~ SOLN
40.00 | SUBCUTANEOUS | Status: DC
Start: 2019-10-09 — End: 2019-10-09

## 2019-10-09 MED ORDER — GENERIC EXTERNAL MEDICATION
Status: DC
Start: ? — End: 2019-10-09

## 2019-10-09 MED ORDER — NICOTINE POLACRILEX 2 MG MT GUM
2.00 | CHEWING_GUM | OROMUCOSAL | Status: DC
Start: ? — End: 2019-10-09

## 2019-10-09 MED ORDER — FOLIC ACID 1 MG PO TABS
1.00 | ORAL_TABLET | ORAL | Status: DC
Start: 2019-10-10 — End: 2019-10-09

## 2019-10-09 MED ORDER — CEFEPIME HCL 2 G IJ SOLR
2.00 | INTRAMUSCULAR | Status: DC
Start: 2019-10-09 — End: 2019-10-09

## 2019-10-09 MED ORDER — ALBUTEROL SULFATE (2.5 MG/3ML) 0.083% IN NEBU
2.50 | INHALATION_SOLUTION | RESPIRATORY_TRACT | Status: DC
Start: ? — End: 2019-10-09

## 2019-10-09 MED ORDER — ACETAMINOPHEN 325 MG PO TABS
650.00 | ORAL_TABLET | ORAL | Status: DC
Start: ? — End: 2019-10-09

## 2019-10-26 IMAGING — CT CT CHEST W/ CM
2 of 3 series · 15 of 36 positions shown, 18 images · IV contrast (iopamidol)
Comparison: The chest radiograph raising concern for pulmonary
nodule is not available on our PACS system and may be from an
outside institution. No comparison exams.

CLINICAL DATA: Pulmonary nodule seen on chest radiograph. Cough x3
months.

EXAM:
CT CHEST WITH CONTRAST
TECHNIQUE: Multidetector CT imaging of the chest was performed during
intravenous contrast administration.
CONTRAST:  75mL [id number] IOPAMIDOL ([id number]-0UU) INJECTION 61%

[Series 2: axial st · axial · 0.71mm/px · z∈[+1291,+1583]mm · 12 of 172 slices shown, 15 images]
[im 13/172  mediastinal]
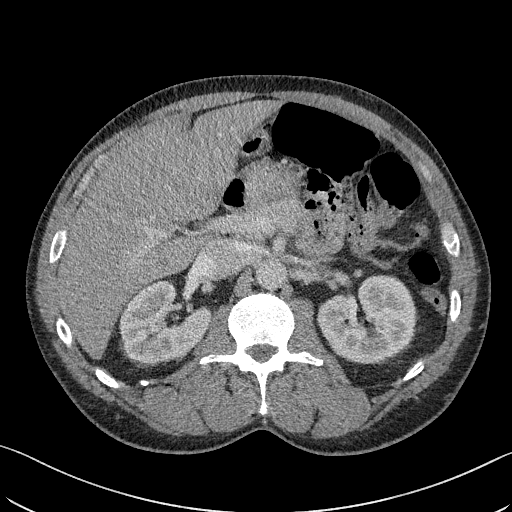
[im 13/172  lung]
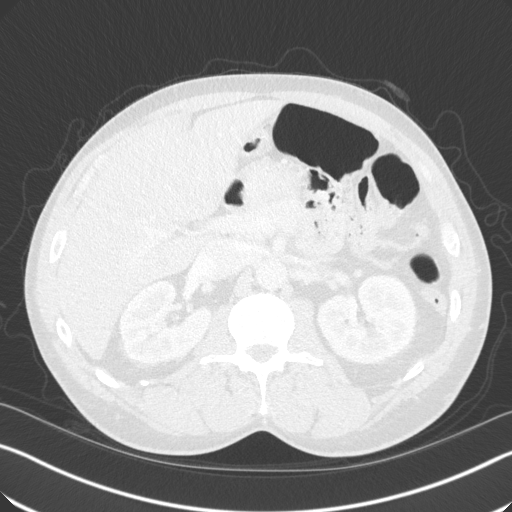
[im 26/172  lung]
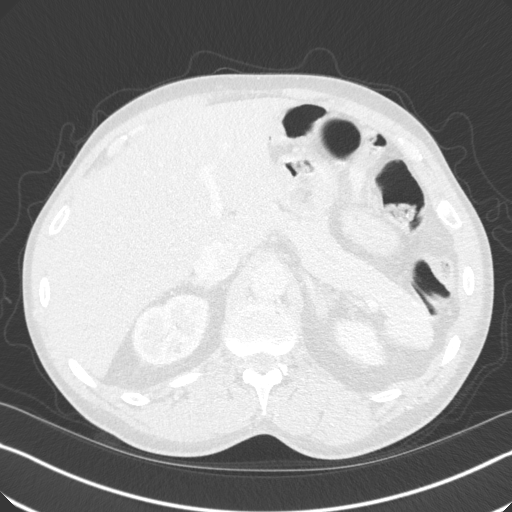
[im 39/172  lung]
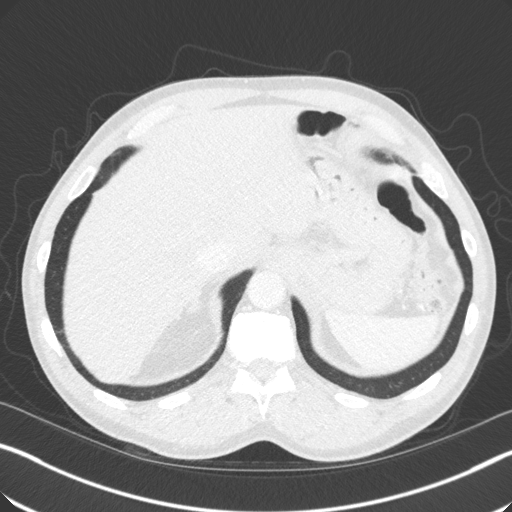
[im 51/172  lung]
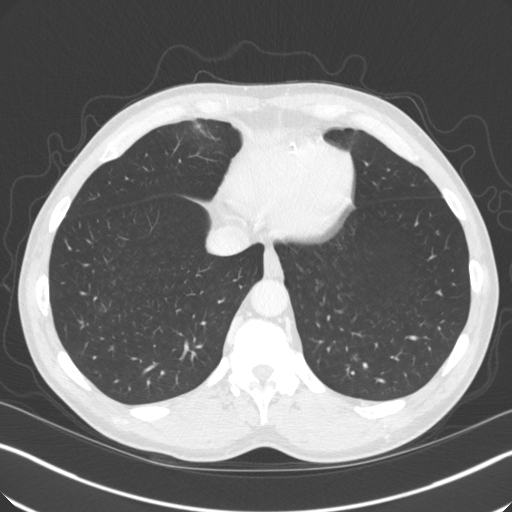
[im 64/172  mediastinal]
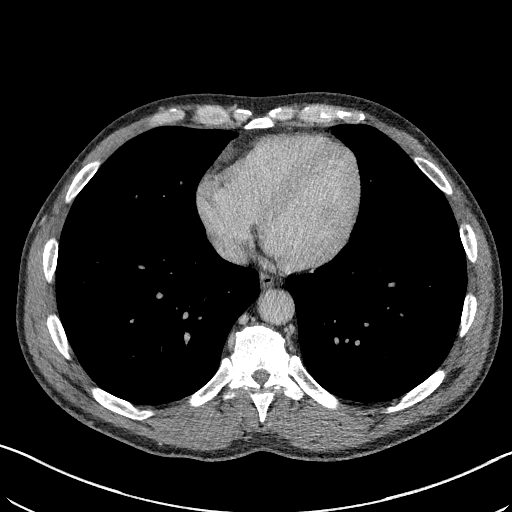
[im 64/172  lung]
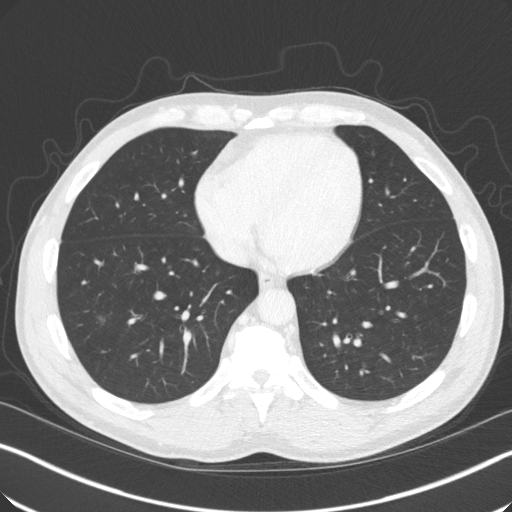
[im 77/172  lung]
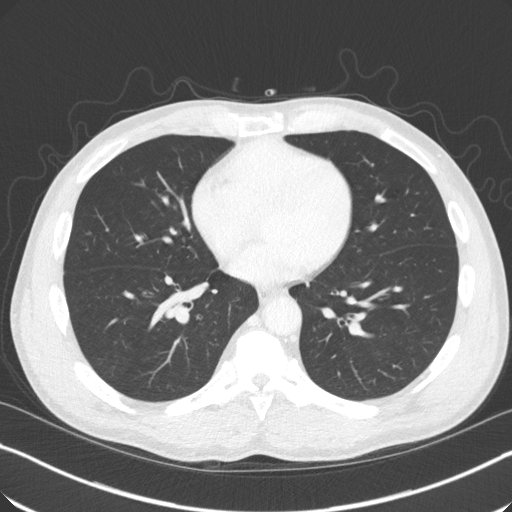
[im 96/172  lung]
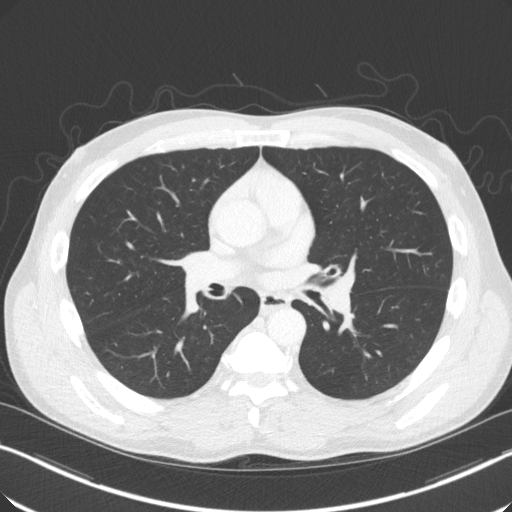
[im 108/172  lung]
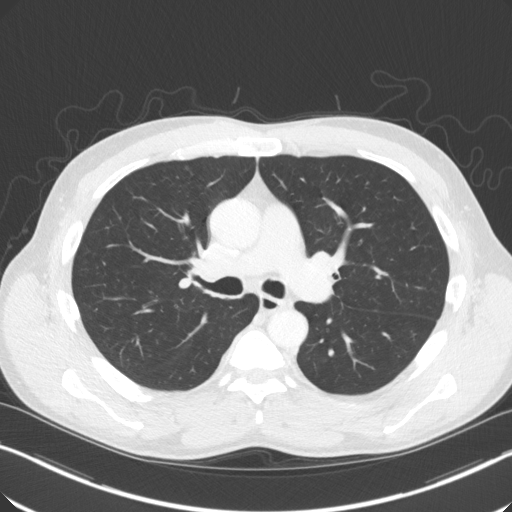
[im 121/172  mediastinal]
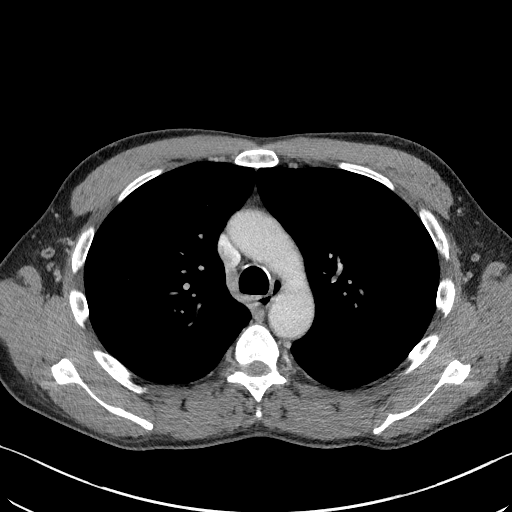
[im 121/172  lung]
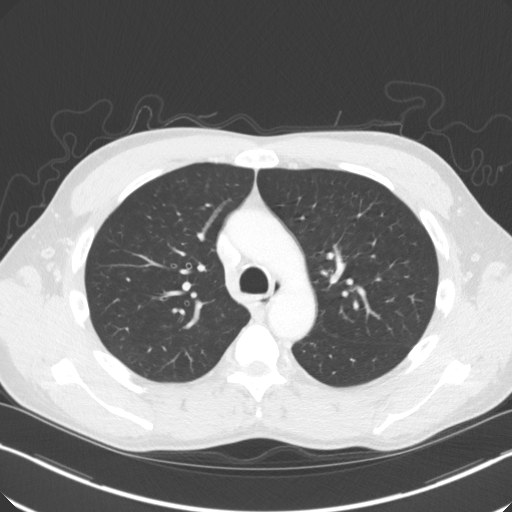
[im 134/172  lung]
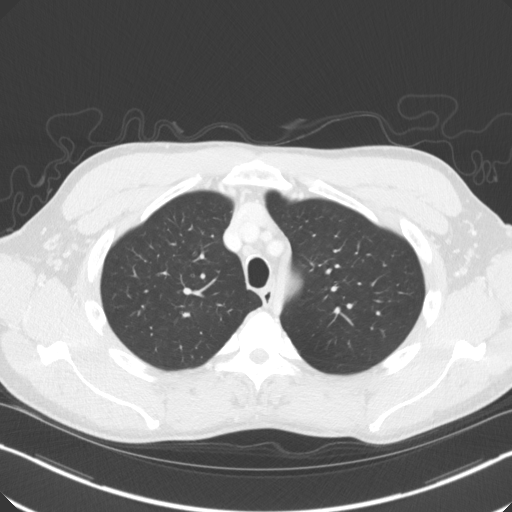
[im 146/172  lung]
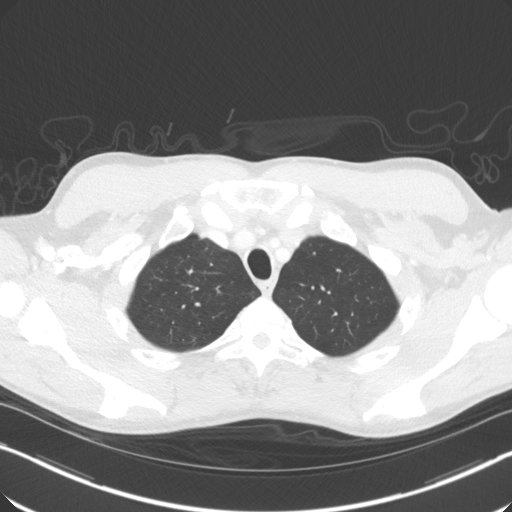
[im 159/172  lung]
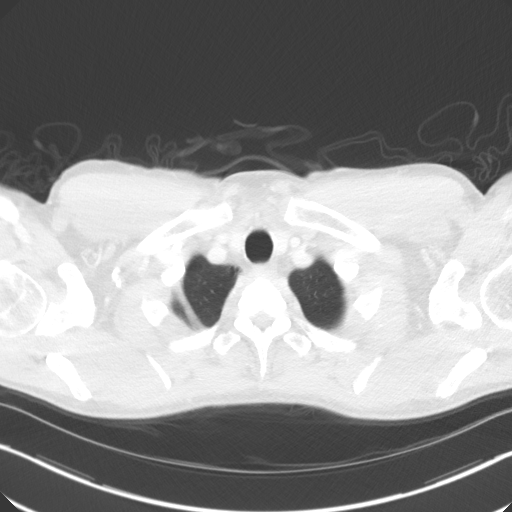

[Series 5: coronal · coronal · 0.67mm/px · 3 of 134 slices shown]
[im 27/134  lung]
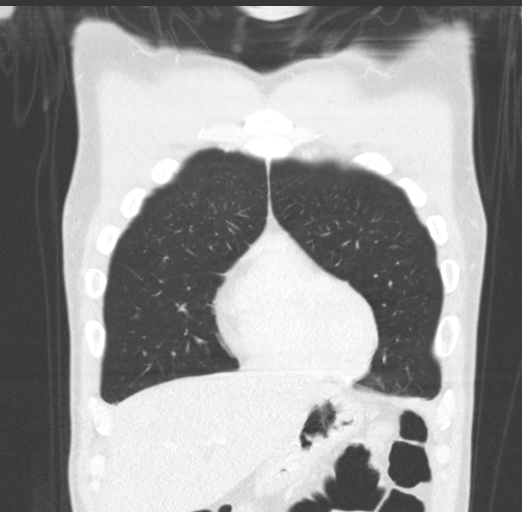
[im 54/134  lung]
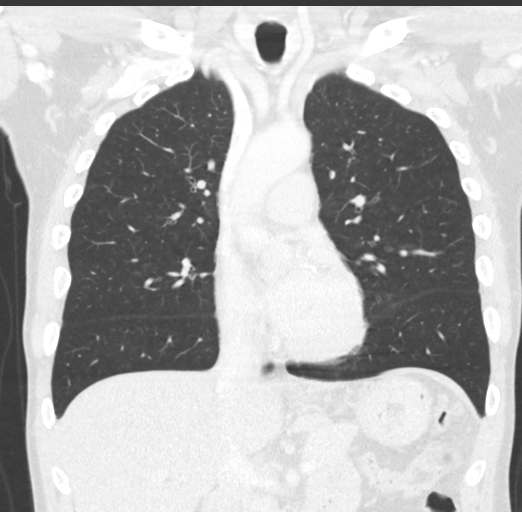
[im 80/134  lung]
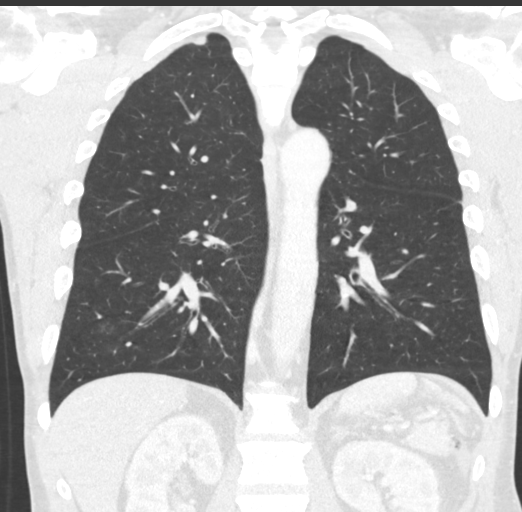

[15 of 36 positions shown; findings below may reference images not displayed]

FINDINGS: Cardiovascular: Normal heart size without pericardial effusion.
Coronary arteriosclerosis along the LAD and left circumflex. Normal
branch pattern of the great vessels. No aortic aneurysm or
dissection. Minimal aortic atherosclerosis along the ascending
portion.

Mediastinum/Nodes: No enlarged mediastinal, hilar, or axillary lymph
nodes. Thyroid gland, trachea, and esophagus demonstrate no
significant findings.

Lungs/Pleura: 9 mm in average noncalcified pulmonary nodule in the
left lower lobe series 4, image 101. Subpleural atelectasis in the
right middle lobe and lingula. No effusion or pneumothorax.

Upper Abdomen: Normal bilateral adrenal glands and included kidneys.
The included liver, gallbladder, pancreas and spleen are nonacute.

Musculoskeletal: No chest wall abnormality. No acute or significant
osseous findings.
IMPRESSION: 1. 9 mm in average noncalcified left lower lobe pulmonary nodule.
Consider one of the following in 3 months for both low-risk and
high-risk individuals: (a) repeat chest CT, (b) follow-up PET-CT, or
(c) tissue sampling. This recommendation follows the consensus
statement: Guidelines for Management of Incidental Pulmonary Nodules
Detected on CT Images: From the [HOSPITAL] 2999; Radiology
2999; [DATE].
2. Coronary arteriosclerosis and minimal aortic atherosclerosis.

Aortic Atherosclerosis (W2CXP-NFO.O).

## 2020-02-14 IMAGING — CT CT CHEST W/O CM
2 of 3 series · 15 of 36 positions shown, 18 images · non-contrast
Comparison: 10/30/2017.

CLINICAL DATA: Left lower lobe nodule.

EXAM:
CT CHEST WITHOUT CONTRAST
TECHNIQUE: Multidetector CT imaging of the chest was performed following the
standard protocol without IV contrast.

[Series 2: thorax · axial · 0.72mm/px · z∈[-200,+58]mm · 12 of 153 slices shown, 15 images]
[im 12/153  mediastinal]
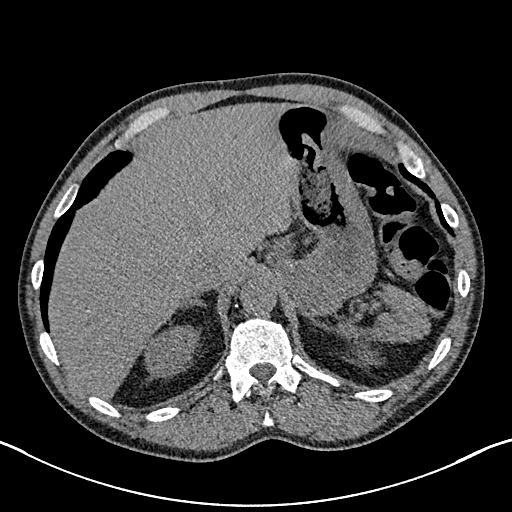
[im 12/153  lung]
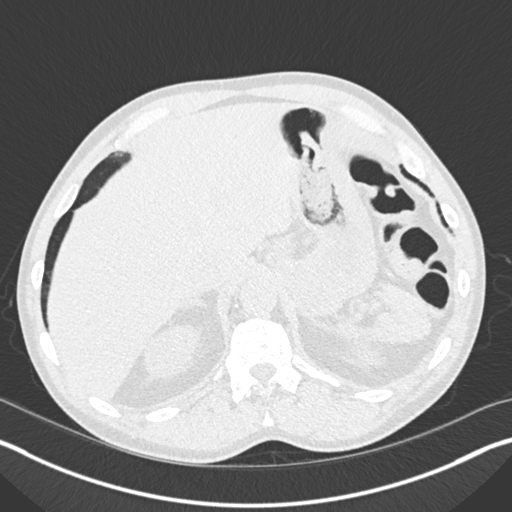
[im 23/153  lung]
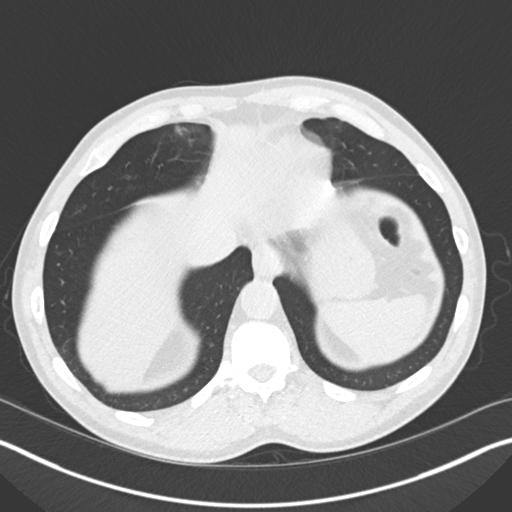
[im 34/153  lung]
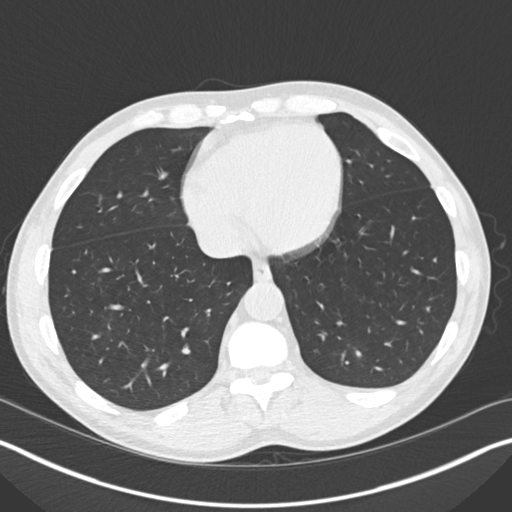
[im 46/153  lung]
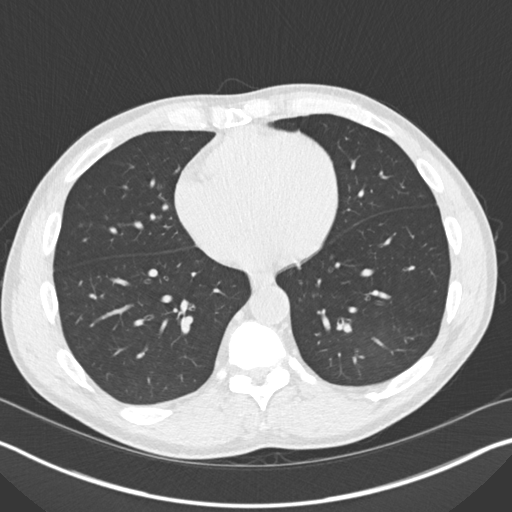
[im 57/153  mediastinal]
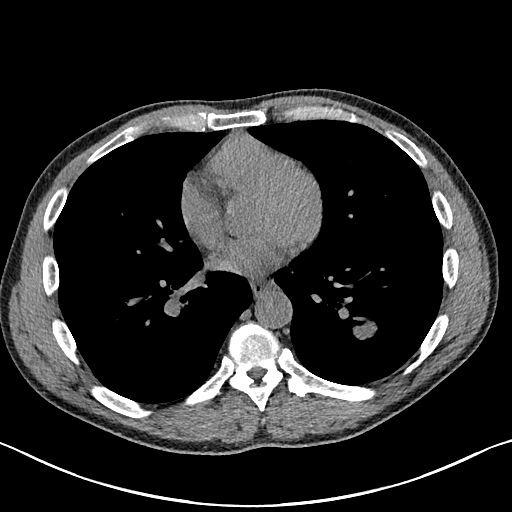
[im 57/153  lung]
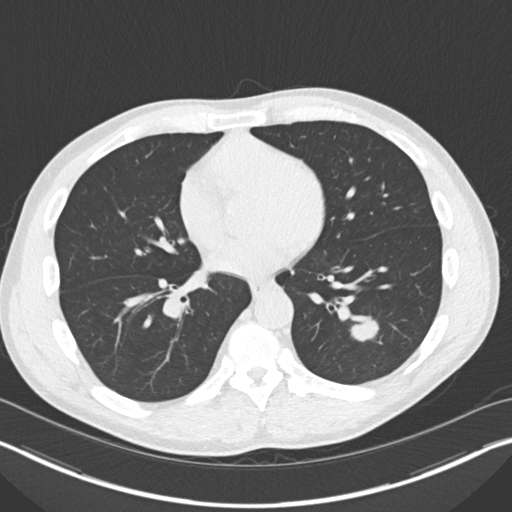
[im 68/153  lung]
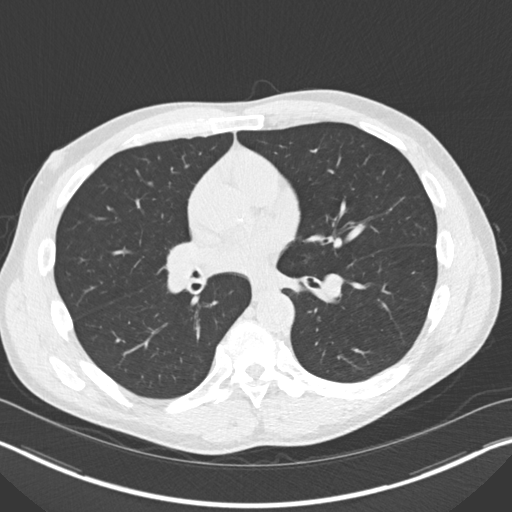
[im 85/153  lung]
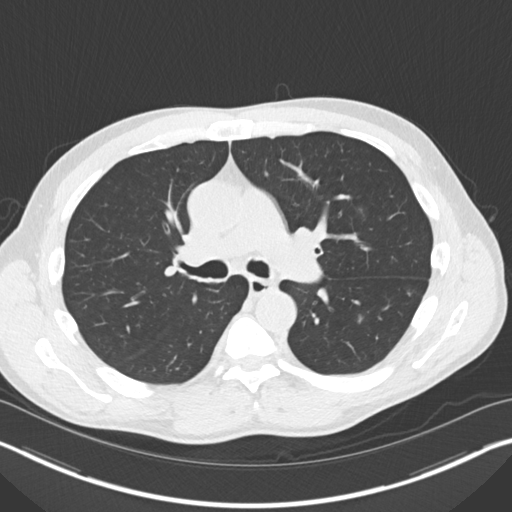
[im 96/153  lung]
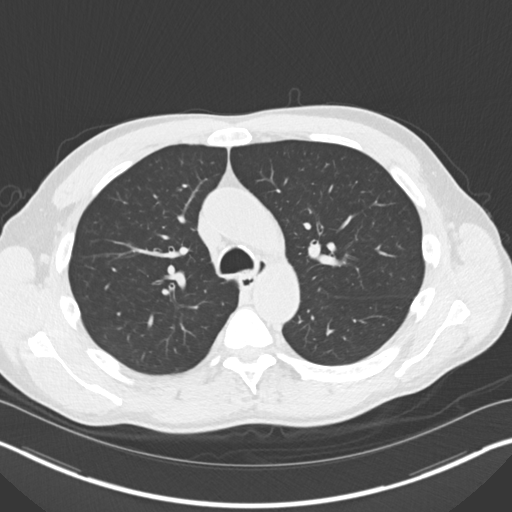
[im 107/153  mediastinal]
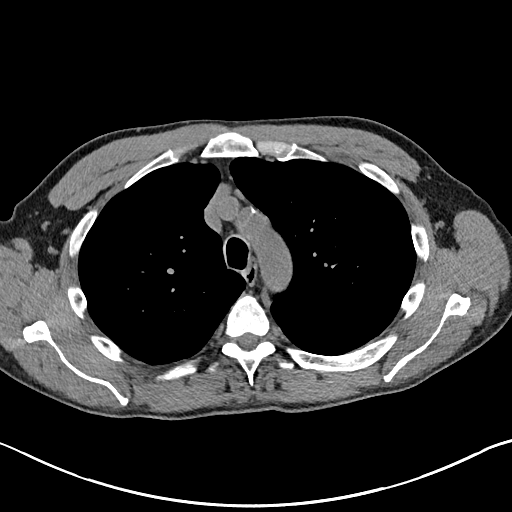
[im 107/153  lung]
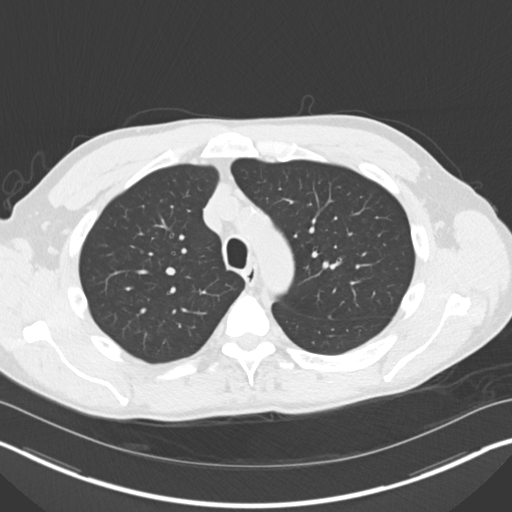
[im 119/153  lung]
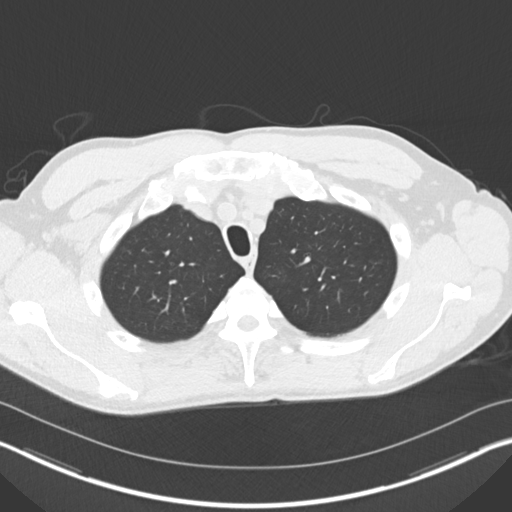
[im 130/153  lung]
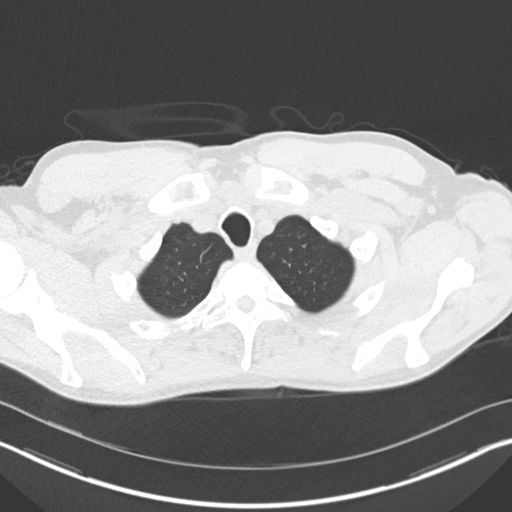
[im 141/153  lung]
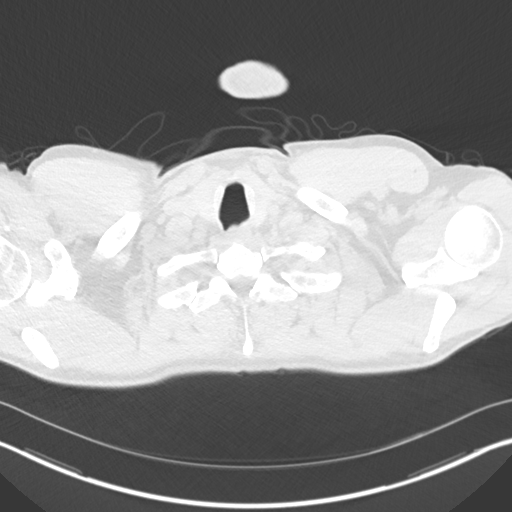

[Series 5: coronal · coronal · 0.62mm/px · 3 of 142 slices shown]
[im 29/142  lung]
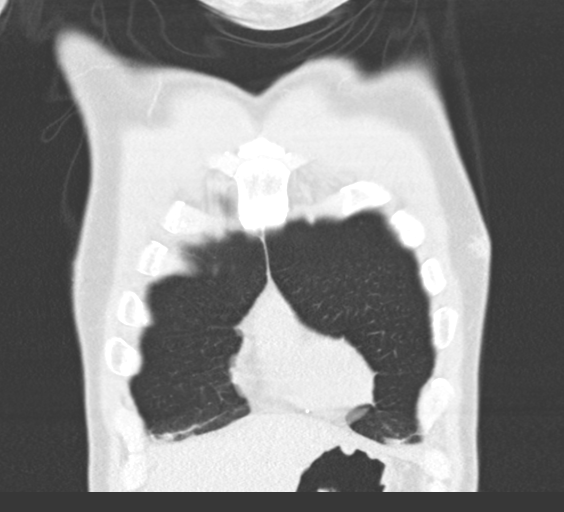
[im 57/142  lung]
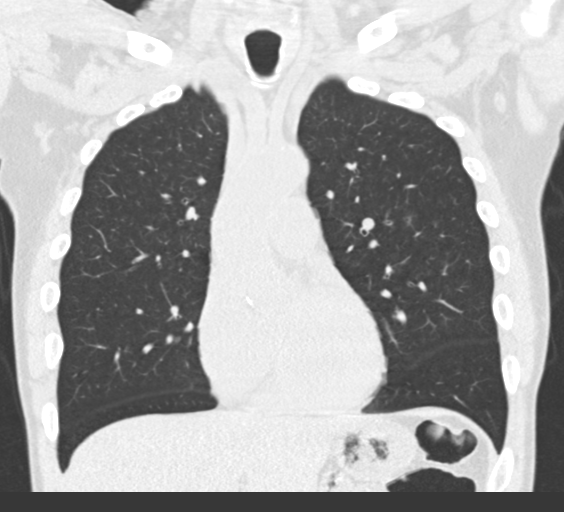
[im 85/142  lung]
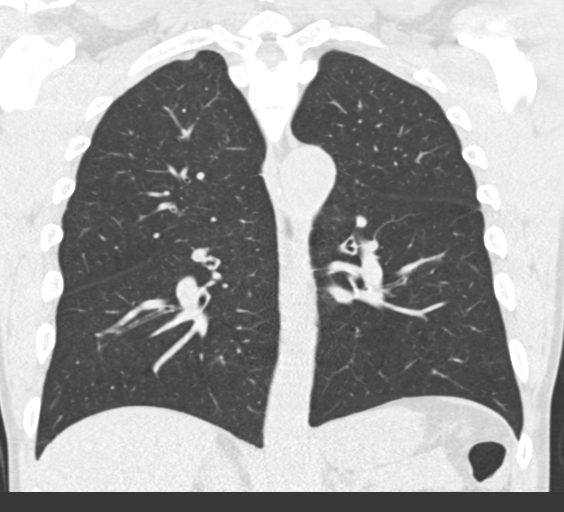

[15 of 36 positions shown; findings below may reference images not displayed]

FINDINGS: Cardiovascular: Coronary artery calcification. Heart size normal. No
pericardial effusion.

Mediastinum/Nodes: No pathologically enlarged mediastinal or
axillary lymph nodes. Hilar regions are difficult to definitively
evaluate without IV contrast but appear grossly unremarkable.
Esophagus is grossly unremarkable.

Lungs/Pleura: Left lower lobe nodule has increased significantly in
size, now measuring 1.7 x 2.2 cm, previously 7 x 11 mm. While the
majority of previously seen peribronchovascular left lower lobe
ground-glass has resolved in the interval, there are new areas of
scattered ground-glass bilaterally. No pleural fluid. Debris is seen
in the airway.

Upper Abdomen: Visualized portions of the liver, adrenal glands,
kidneys, spleen, pancreas, stomach and bowel are grossly
unremarkable. No upper abdominal adenopathy.

Musculoskeletal: Degenerative changes in the spine. No worrisome
lytic or sclerotic lesions.
IMPRESSION: 1. Left lower lobe nodule has increased significantly in size from
10/30/2017 and is worrisome for primary bronchogenic carcinoma.
These results will be called to the ordering clinician or
representative by the Radiologist Assistant, and communication
documented in the PACS or zVision Dashboard.
2. New areas of scattered peribronchovascular ground-glass, likely
infectious or inflammatory in etiology.
3. Coronary artery calcification.

## 2020-03-18 IMAGING — CR DG TIBIA/FIBULA 2V*L*
1 series · 4 of 4 positions shown · non-contrast
Comparison: None.

CLINICAL DATA: Initial evaluation for acute trauma, dog bite.

EXAM:
LEFT TIBIA AND FIBULA - 2 VIEW

[Series 1: ap · 0.17mm/px · 4 of 4 slices shown]
[im 1/4]
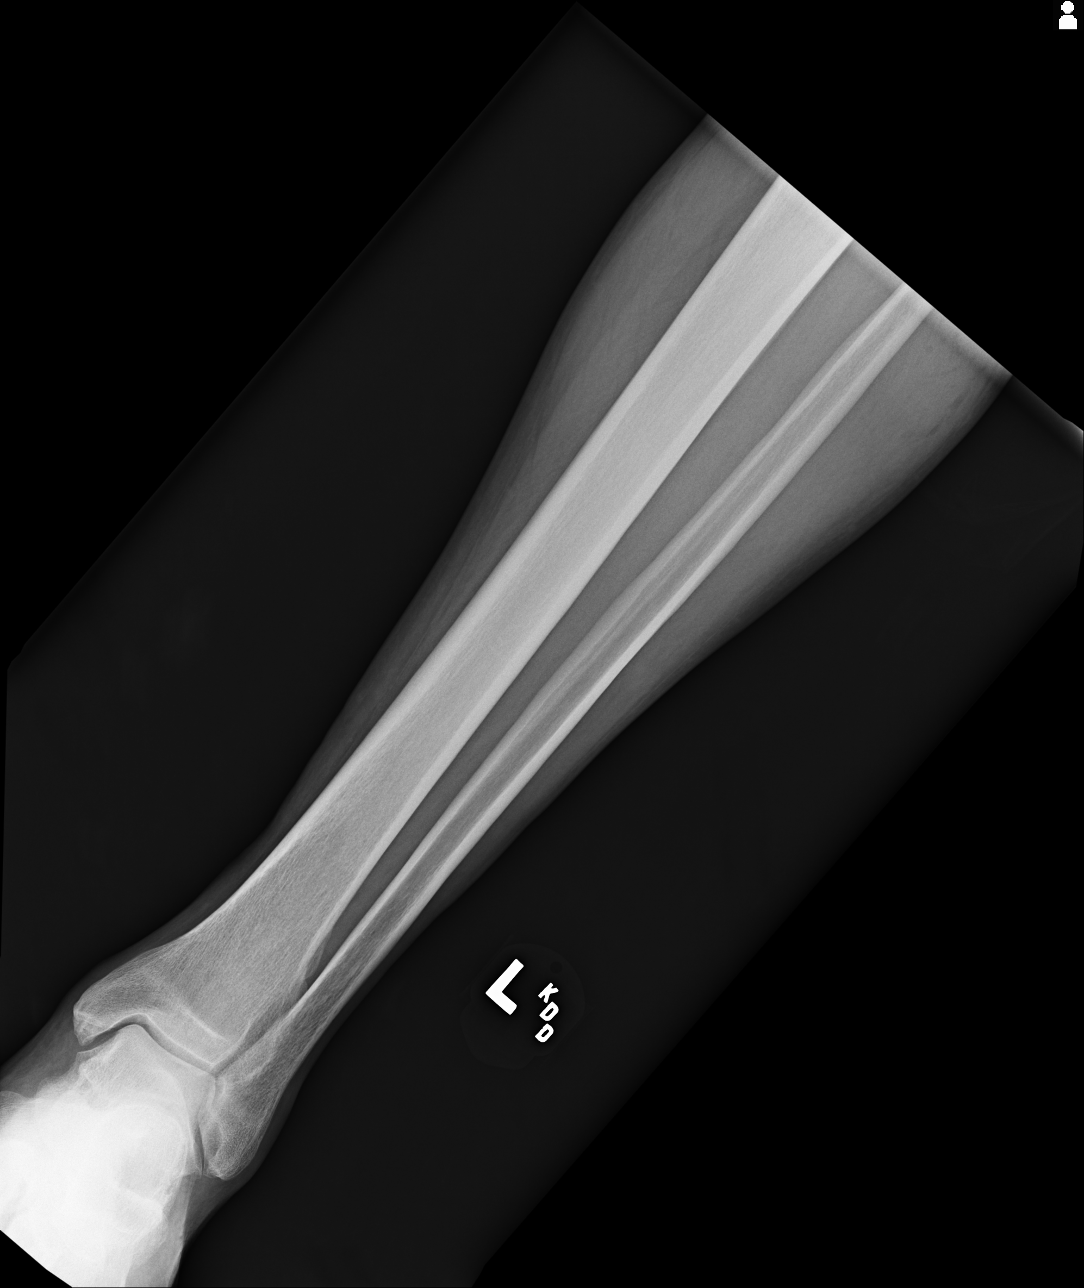
[im 2/4]
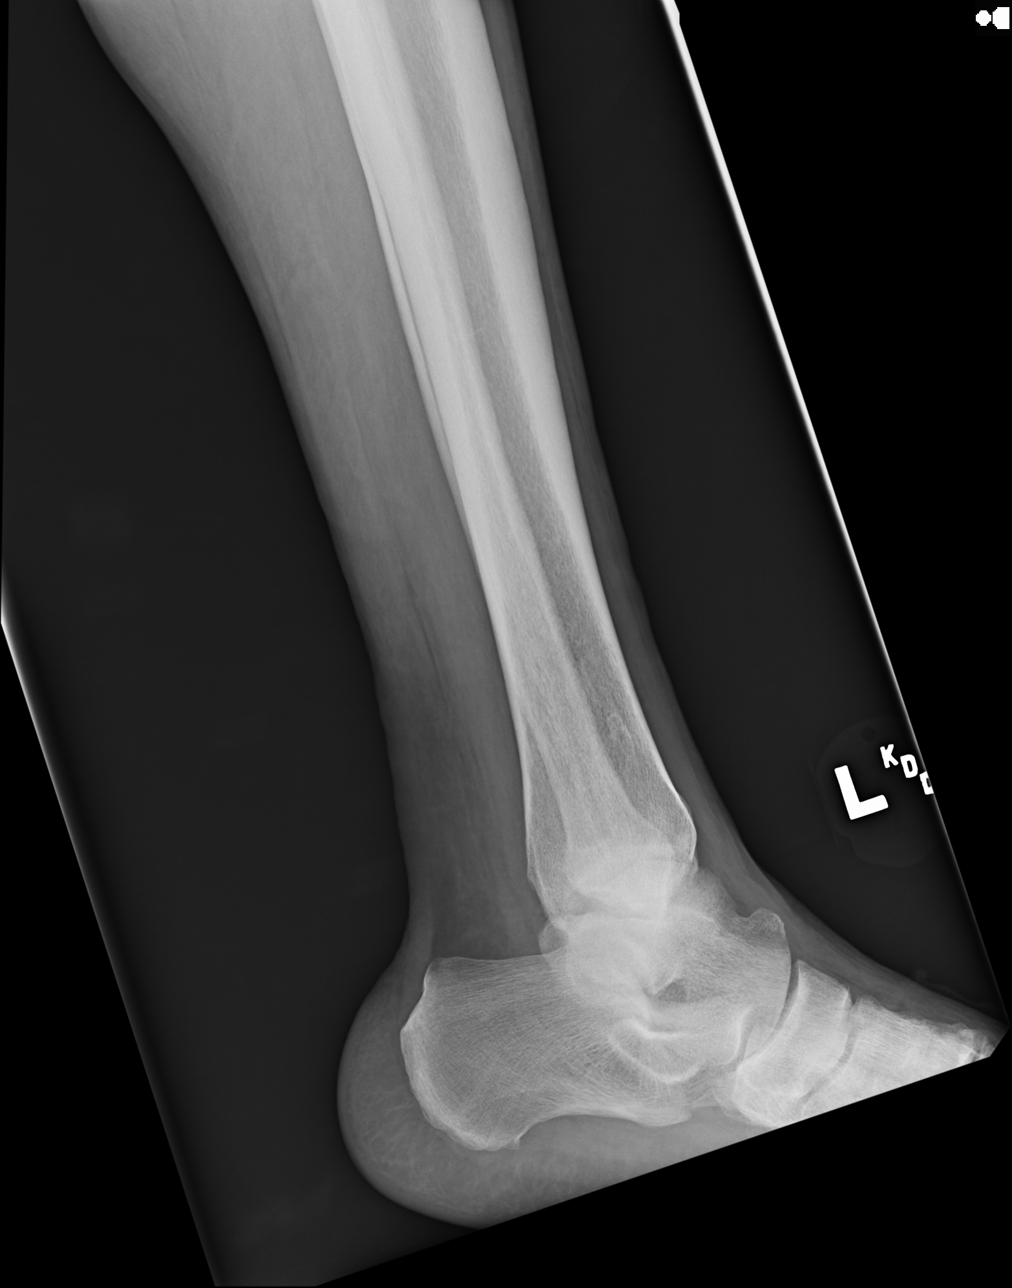
[im 3/4]
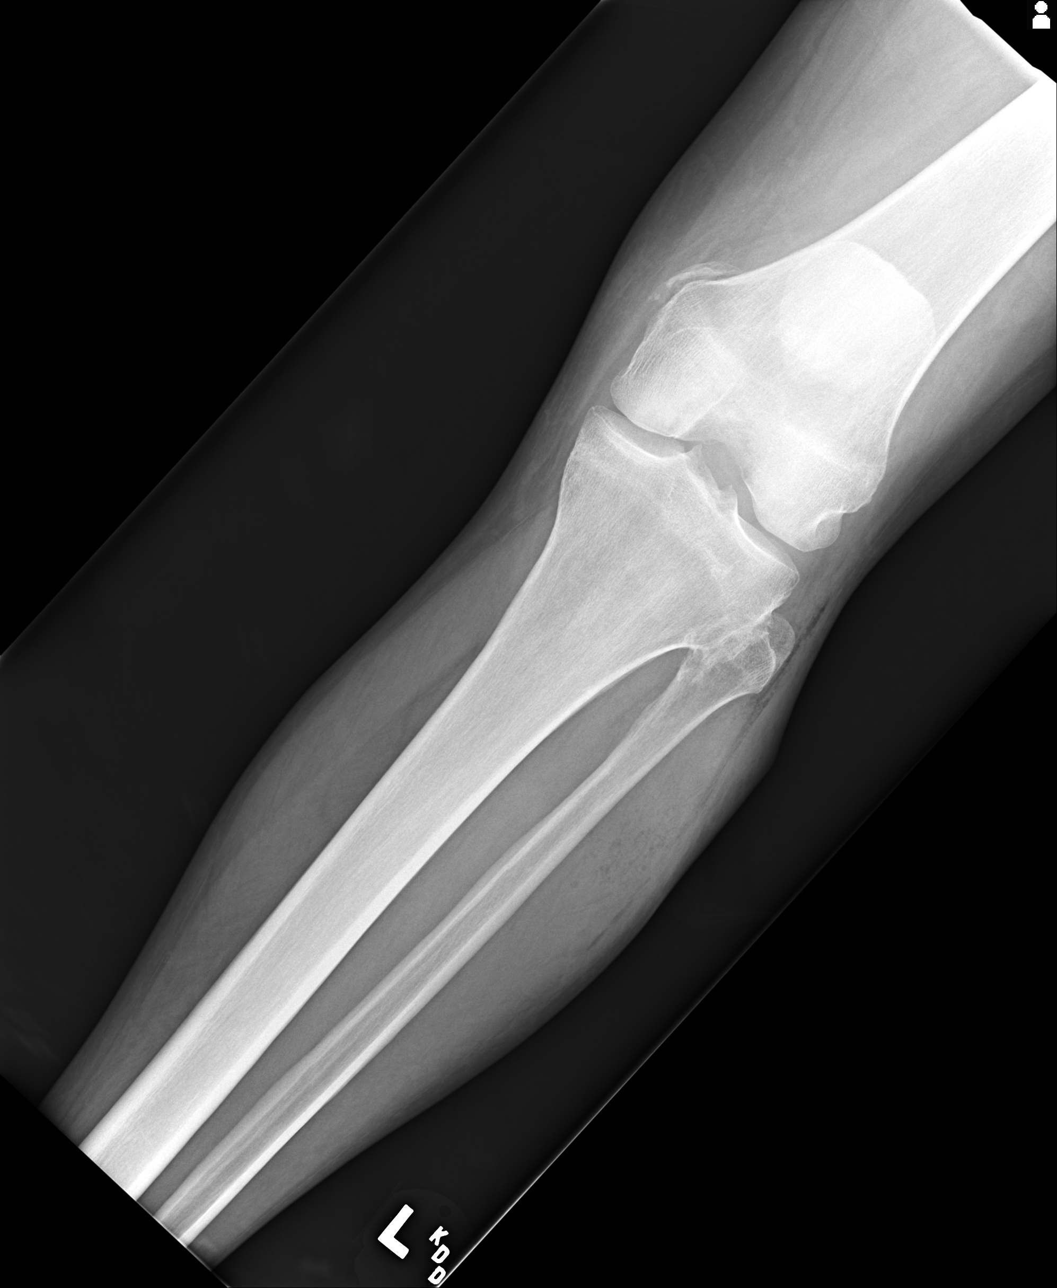
[im 4/4]
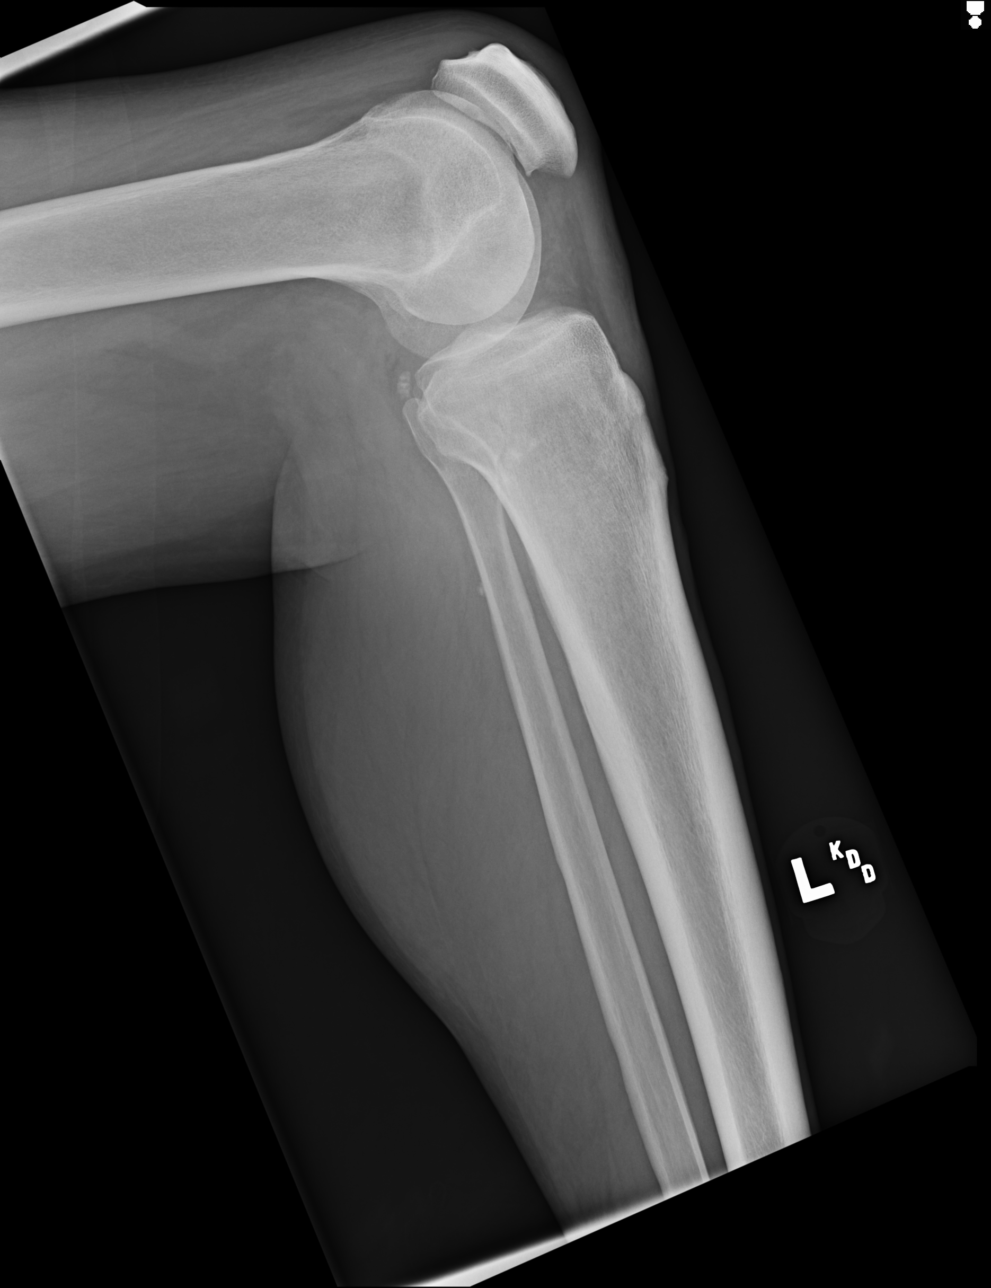

[4 of 4 positions shown; findings below may reference images not displayed]

FINDINGS: No acute fracture or dislocation. Soft tissue irregularity with
emphysema within the lateral aspect of the proximal left leg, likely
related dog bite. No radiopaque foreign body. Degenerative changes
noted about the knee.
IMPRESSION: 1. Soft tissue irregularity with emphysema at the lateral aspect of
the proximal left leg, likely related to history of dog bite. No
radiopaque foreign body.
2. No acute osseous abnormality.

## 2021-01-28 ENCOUNTER — Emergency Department (HOSPITAL_COMMUNITY): Payer: BLUE CROSS/BLUE SHIELD

## 2021-01-28 ENCOUNTER — Inpatient Hospital Stay (HOSPITAL_COMMUNITY)
Admission: EM | Admit: 2021-01-28 | Discharge: 2021-01-30 | DRG: 438 | Disposition: A | Discharge status: Home / Self Care | Payer: BLUE CROSS/BLUE SHIELD | Attending: Internal Medicine | Admitting: Internal Medicine

## 2021-01-28 ENCOUNTER — Encounter (HOSPITAL_COMMUNITY): Payer: Self-pay | Admitting: Internal Medicine

## 2021-01-28 ENCOUNTER — Other Ambulatory Visit: Payer: Self-pay

## 2021-01-28 DIAGNOSIS — J189 Pneumonia, unspecified organism: Secondary | ICD-10-CM | POA: Diagnosis present

## 2021-01-28 DIAGNOSIS — I10 Essential (primary) hypertension: Secondary | ICD-10-CM | POA: Diagnosis not present

## 2021-01-28 DIAGNOSIS — E7220 Disorder of urea cycle metabolism, unspecified: Secondary | ICD-10-CM | POA: Diagnosis present

## 2021-01-28 DIAGNOSIS — K219 Gastro-esophageal reflux disease without esophagitis: Secondary | ICD-10-CM

## 2021-01-28 DIAGNOSIS — N1832 Chronic kidney disease, stage 3b: Secondary | ICD-10-CM | POA: Diagnosis present

## 2021-01-28 DIAGNOSIS — Z8249 Family history of ischemic heart disease and other diseases of the circulatory system: Secondary | ICD-10-CM

## 2021-01-28 DIAGNOSIS — F17200 Nicotine dependence, unspecified, uncomplicated: Secondary | ICD-10-CM | POA: Diagnosis present

## 2021-01-28 DIAGNOSIS — I700 Atherosclerosis of aorta: Secondary | ICD-10-CM | POA: Diagnosis present

## 2021-01-28 DIAGNOSIS — Z20822 Contact with and (suspected) exposure to covid-19: Secondary | ICD-10-CM | POA: Diagnosis present

## 2021-01-28 DIAGNOSIS — I129 Hypertensive chronic kidney disease with stage 1 through stage 4 chronic kidney disease, or unspecified chronic kidney disease: Secondary | ICD-10-CM | POA: Diagnosis present

## 2021-01-28 DIAGNOSIS — F1010 Alcohol abuse, uncomplicated: Secondary | ICD-10-CM

## 2021-01-28 DIAGNOSIS — E7800 Pure hypercholesterolemia, unspecified: Secondary | ICD-10-CM | POA: Diagnosis present

## 2021-01-28 DIAGNOSIS — K852 Alcohol induced acute pancreatitis without necrosis or infection: Secondary | ICD-10-CM | POA: Diagnosis present

## 2021-01-28 DIAGNOSIS — J9601 Acute respiratory failure with hypoxia: Secondary | ICD-10-CM | POA: Diagnosis present

## 2021-01-28 DIAGNOSIS — D631 Anemia in chronic kidney disease: Secondary | ICD-10-CM | POA: Diagnosis present

## 2021-01-28 DIAGNOSIS — R569 Unspecified convulsions: Secondary | ICD-10-CM | POA: Diagnosis present

## 2021-01-28 DIAGNOSIS — Z79899 Other long term (current) drug therapy: Secondary | ICD-10-CM

## 2021-01-28 DIAGNOSIS — Y900 Blood alcohol level of less than 20 mg/100 ml: Secondary | ICD-10-CM | POA: Diagnosis present

## 2021-01-28 DIAGNOSIS — D649 Anemia, unspecified: Secondary | ICD-10-CM | POA: Diagnosis present

## 2021-01-28 DIAGNOSIS — E871 Hypo-osmolality and hyponatremia: Secondary | ICD-10-CM | POA: Diagnosis present

## 2021-01-28 DIAGNOSIS — J9602 Acute respiratory failure with hypercapnia: Secondary | ICD-10-CM | POA: Diagnosis present

## 2021-01-28 DIAGNOSIS — D509 Iron deficiency anemia, unspecified: Secondary | ICD-10-CM | POA: Diagnosis present

## 2021-01-28 DIAGNOSIS — K859 Acute pancreatitis without necrosis or infection, unspecified: Secondary | ICD-10-CM | POA: Diagnosis present

## 2021-01-28 HISTORY — DX: Chronic kidney disease, stage 3b: N18.32

## 2021-01-28 LAB — CBC
HCT: 33.6 % — ABNORMAL LOW (ref 39.0–52.0)
Hemoglobin: 11.2 g/dL — ABNORMAL LOW (ref 13.0–17.0)
MCH: 34.4 pg — ABNORMAL HIGH (ref 26.0–34.0)
MCHC: 33.3 g/dL (ref 30.0–36.0)
MCV: 103.1 fL — ABNORMAL HIGH (ref 80.0–100.0)
Platelets: 286 10*3/uL (ref 150–400)
RBC: 3.26 MIL/uL — ABNORMAL LOW (ref 4.22–5.81)
RDW: 14.7 % (ref 11.5–15.5)
WBC: 12.7 10*3/uL — ABNORMAL HIGH (ref 4.0–10.5)
nRBC: 0.9 % — ABNORMAL HIGH (ref 0.0–0.2)

## 2021-01-28 LAB — COMPREHENSIVE METABOLIC PANEL
ALT: 18 U/L (ref 0–44)
ALT: 11 U/L (ref 0–44)
AST: 29 U/L (ref 15–41)
AST: 30 U/L (ref 15–41)
Albumin: 4 g/dL (ref 3.5–5.0)
Albumin: 3.3 g/dL — ABNORMAL LOW (ref 3.5–5.0)
Alkaline Phosphatase: 115 U/L (ref 38–126)
Alkaline Phosphatase: 88 U/L (ref 38–126)
Anion gap: 13 (ref 5–15)
Anion gap: 11 (ref 5–15)
BUN: 23 mg/dL — ABNORMAL HIGH (ref 6–20)
BUN: 21 mg/dL — ABNORMAL HIGH (ref 6–20)
CO2: 24 mmol/L (ref 22–32)
CO2: 20 mmol/L — ABNORMAL LOW (ref 22–32)
Calcium: 9.1 mg/dL (ref 8.9–10.3)
Calcium: 8.4 mg/dL — ABNORMAL LOW (ref 8.9–10.3)
Chloride: 96 mmol/L — ABNORMAL LOW (ref 98–111)
Chloride: 104 mmol/L (ref 98–111)
Creatinine, Ser: 1.91 mg/dL — ABNORMAL HIGH (ref 0.61–1.24)
Creatinine, Ser: 1.66 mg/dL — ABNORMAL HIGH (ref 0.61–1.24)
GFR, Estimated: 40 mL/min — ABNORMAL LOW (ref >60)
GFR, Estimated: 47 mL/min — ABNORMAL LOW (ref >60)
Glucose, Bld: 153 mg/dL — ABNORMAL HIGH (ref 70–99)
Glucose, Bld: 77 mg/dL (ref 70–99)
Potassium: 4 mmol/L (ref 3.5–5.1)
Potassium: 4.4 mmol/L (ref 3.5–5.1)
Sodium: 133 mmol/L — ABNORMAL LOW (ref 135–145)
Sodium: 135 mmol/L (ref 135–145)
Total Bilirubin: 2 mg/dL — ABNORMAL HIGH (ref 0.3–1.2)
Total Bilirubin: 1.1 mg/dL (ref 0.3–1.2)
Total Protein: 8.5 g/dL — ABNORMAL HIGH (ref 6.5–8.1)
Total Protein: 6.8 g/dL (ref 6.5–8.1)

## 2021-01-28 LAB — CBC WITH DIFFERENTIAL/PLATELET
Abs Immature Granulocytes: 0.05 10*3/uL (ref 0.00–0.07)
Basophils Absolute: 0 10*3/uL (ref 0.0–0.1)
Basophils Relative: 0 %
Eosinophils Absolute: 0 10*3/uL (ref 0.0–0.5)
Eosinophils Relative: 0 %
HCT: 37.9 % — ABNORMAL LOW (ref 39.0–52.0)
Hemoglobin: 12.9 g/dL — ABNORMAL LOW (ref 13.0–17.0)
Immature Granulocytes: 0 %
Lymphocytes Relative: 7 %
Lymphs Abs: 0.8 10*3/uL (ref 0.7–4.0)
MCH: 34 pg (ref 26.0–34.0)
MCHC: 34 g/dL (ref 30.0–36.0)
MCV: 100 fL (ref 80.0–100.0)
Monocytes Absolute: 1.1 10*3/uL — ABNORMAL HIGH (ref 0.1–1.0)
Monocytes Relative: 9 %
Neutro Abs: 9.7 10*3/uL — ABNORMAL HIGH (ref 1.7–7.7)
Neutrophils Relative %: 84 %
Platelets: 340 10*3/uL (ref 150–400)
RBC: 3.79 MIL/uL — ABNORMAL LOW (ref 4.22–5.81)
RDW: 14.4 % (ref 11.5–15.5)
WBC: 11.7 10*3/uL — ABNORMAL HIGH (ref 4.0–10.5)
nRBC: 1.1 % — ABNORMAL HIGH (ref 0.0–0.2)

## 2021-01-28 LAB — BLOOD GAS, ARTERIAL
Acid-base deficit: 1.3 mmol/L (ref 0.0–2.0)
Bicarbonate: 23.3 mmol/L (ref 20.0–28.0)
FIO2: 35
O2 Saturation: 98.9 %
Patient temperature: 37
pCO2 arterial: 41.3 mmHg (ref 32.0–48.0)
pH, Arterial: 7.369 (ref 7.350–7.450)
pO2, Arterial: 159 mmHg — ABNORMAL HIGH (ref 83.0–108.0)

## 2021-01-28 LAB — RESP PANEL BY RT-PCR (FLU A&B, COVID) ARPGX2
Influenza A by PCR: NEGATIVE
Influenza B by PCR: NEGATIVE
SARS Coronavirus 2 by RT PCR: NEGATIVE

## 2021-01-28 LAB — BLOOD GAS, VENOUS
Acid-Base Excess: 0.1 mmol/L (ref 0.0–2.0)
Bicarbonate: 20.8 mmol/L (ref 20.0–28.0)
FIO2: 80
O2 Saturation: 27.7 %
Patient temperature: 36.5
pCO2, Ven: 83.5 mmHg (ref 44.0–60.0)
pH, Ven: 7.153 — CL (ref 7.250–7.430)
pO2, Ven: <31 mmHg — CL (ref 32.0–45.0)

## 2021-01-28 LAB — URINALYSIS, ROUTINE W REFLEX MICROSCOPIC
Bacteria, UA: NONE SEEN
Bilirubin Urine: NEGATIVE
Glucose, UA: NEGATIVE mg/dL
Ketones, ur: NEGATIVE mg/dL
Leukocytes,Ua: NEGATIVE
Nitrite: NEGATIVE
Protein, ur: 100 mg/dL — AB
Specific Gravity, Urine: 1.026 (ref 1.005–1.030)
pH: 6 (ref 5.0–8.0)

## 2021-01-28 LAB — TROPONIN I (HIGH SENSITIVITY): Troponin I (High Sensitivity): 11 ng/L (ref <18)

## 2021-01-28 LAB — MRSA PCR SCREENING: MRSA by PCR: NEGATIVE

## 2021-01-28 LAB — ETHANOL: Alcohol, Ethyl (B): <10 mg/dL (ref <10)

## 2021-01-28 LAB — MAGNESIUM
Magnesium: 1.7 mg/dL (ref 1.7–2.4)
Magnesium: 1.9 mg/dL (ref 1.7–2.4)

## 2021-01-28 LAB — PHOSPHORUS
Phosphorus: 3.2 mg/dL (ref 2.5–4.6)
Phosphorus: 3.2 mg/dL (ref 2.5–4.6)
Phosphorus: 2.5 mg/dL (ref 2.5–4.6)

## 2021-01-28 LAB — LACTIC ACID, PLASMA
Lactic Acid, Venous: 2.6 mmol/L (ref 0.5–1.9)
Lactic Acid, Venous: 2.1 mmol/L (ref 0.5–1.9)

## 2021-01-28 LAB — PROTIME-INR
INR: 1 (ref 0.8–1.2)
Prothrombin Time: 13.2 seconds (ref 11.4–15.2)

## 2021-01-28 LAB — LIPASE, BLOOD: Lipase: 579 U/L — ABNORMAL HIGH (ref 11–51)

## 2021-01-28 LAB — AMMONIA: Ammonia: 58 umol/L — ABNORMAL HIGH (ref 9–35)

## 2021-01-28 MED ORDER — IOHEXOL 350 MG/ML SOLN
100.0000 mL | Freq: Once | INTRAVENOUS | Status: AC | PRN
  Administered 2021-01-28: 100 mL via INTRAVENOUS

## 2021-01-28 MED ORDER — ACETAMINOPHEN 325 MG PO TABS: 650.0000 mg | ORAL_TABLET | Freq: Four times a day (QID) | ORAL | Status: DC | PRN

## 2021-01-28 MED ORDER — THIAMINE HCL 100 MG/ML IJ SOLN
100.0000 mg | Freq: Every day | INTRAMUSCULAR | Status: DC
  Administered 2021-01-28: 100 mg via INTRAVENOUS
  Filled 2021-01-28: qty 2

## 2021-01-28 MED ORDER — LORAZEPAM 2 MG/ML IJ SOLN
INTRAMUSCULAR | Status: AC
  Administered 2021-01-28: 1 mg via INTRAVENOUS
  Filled 2021-01-28: qty 1

## 2021-01-28 MED ORDER — THIAMINE HCL 100 MG PO TABS
100.0000 mg | ORAL_TABLET | Freq: Every day | ORAL | Status: DC
  Administered 2021-01-29 – 2021-01-30 (×2): 100 mg via ORAL
  Filled 2021-01-28 (×2): qty 1

## 2021-01-28 MED ORDER — LORAZEPAM 2 MG/ML IJ SOLN
0.0000 mg | Freq: Four times a day (QID) | INTRAMUSCULAR | Status: DC
  Administered 2021-01-29: 2 mg via INTRAVENOUS
  Filled 2021-01-28: qty 1

## 2021-01-28 MED ORDER — ONDANSETRON HCL 4 MG/2ML IJ SOLN: 4.0000 mg | Freq: Four times a day (QID) | INTRAMUSCULAR | Status: DC | PRN

## 2021-01-28 MED ORDER — SODIUM CHLORIDE 0.9 % IV SOLN
500.0000 mg | INTRAVENOUS | Status: DC
  Administered 2021-01-29 – 2021-01-30 (×2): 500 mg via INTRAVENOUS
  Filled 2021-01-28 (×2): qty 500

## 2021-01-28 MED ORDER — ONDANSETRON HCL 4 MG PO TABS: 4.0000 mg | ORAL_TABLET | Freq: Four times a day (QID) | ORAL | Status: DC | PRN

## 2021-01-28 MED ORDER — LORAZEPAM 2 MG/ML IJ SOLN: 0.0000 mg | Freq: Two times a day (BID) | INTRAMUSCULAR | Status: DC

## 2021-01-28 MED ORDER — ACETAMINOPHEN 650 MG RE SUPP: 650.0000 mg | Freq: Four times a day (QID) | RECTAL | Status: DC | PRN

## 2021-01-28 MED ORDER — LORAZEPAM 2 MG/ML IJ SOLN
1.0000 mg | Freq: Once | INTRAMUSCULAR | Status: AC
  Administered 2021-01-28: 1 mg via INTRAVENOUS
  Filled 2021-01-28: qty 1

## 2021-01-28 MED ORDER — FOLIC ACID 1 MG PO TABS
1.0000 mg | ORAL_TABLET | Freq: Every day | ORAL | Status: DC
  Administered 2021-01-29 – 2021-01-30 (×2): 1 mg via ORAL
  Filled 2021-01-28 (×2): qty 1

## 2021-01-28 MED ORDER — SODIUM CHLORIDE 0.9 % IV SOLN
2.0000 g | Freq: Once | INTRAVENOUS | Status: AC
  Administered 2021-01-28: 2 g via INTRAVENOUS
  Filled 2021-01-28: qty 20

## 2021-01-28 MED ORDER — CHLORHEXIDINE GLUCONATE CLOTH 2 % EX PADS
6.0000 | MEDICATED_PAD | Freq: Every day | CUTANEOUS | Status: DC
  Administered 2021-01-28 – 2021-01-30 (×3): 6 via TOPICAL

## 2021-01-28 MED ORDER — SODIUM CHLORIDE 0.9 % IV SOLN
500.0000 mg | Freq: Once | INTRAVENOUS | Status: AC
  Administered 2021-01-28: 500 mg via INTRAVENOUS
  Filled 2021-01-28: qty 500

## 2021-01-28 MED ORDER — DM-GUAIFENESIN ER 30-600 MG PO TB12
1.0000 | ORAL_TABLET | Freq: Two times a day (BID) | ORAL | Status: DC
  Administered 2021-01-29 – 2021-01-30 (×3): 1 via ORAL
  Filled 2021-01-28 (×4): qty 1

## 2021-01-28 MED ORDER — SODIUM CHLORIDE 0.9 % IV BOLUS
1000.0000 mL | Freq: Once | INTRAVENOUS | Status: AC
  Administered 2021-01-28: 1000 mL via INTRAVENOUS

## 2021-01-28 MED ORDER — LORAZEPAM 1 MG PO TABS: 1.0000 mg | ORAL_TABLET | ORAL | Status: DC | PRN

## 2021-01-28 MED ORDER — PANTOPRAZOLE SODIUM 40 MG IV SOLR
40.0000 mg | INTRAVENOUS | Status: DC
  Administered 2021-01-28: 40 mg via INTRAVENOUS
  Filled 2021-01-28: qty 40

## 2021-01-28 MED ORDER — LABETALOL HCL 5 MG/ML IV SOLN
10.0000 mg | Freq: Once | INTRAVENOUS | Status: AC
  Administered 2021-01-28: 10 mg via INTRAVENOUS
  Filled 2021-01-28: qty 4

## 2021-01-28 MED ORDER — SODIUM CHLORIDE 0.9 % IV SOLN
INTRAVENOUS | Status: DC
  Administered 2021-01-28: 1000 mL via INTRAVENOUS

## 2021-01-28 MED ORDER — ADULT MULTIVITAMIN W/MINERALS CH
1.0000 | ORAL_TABLET | Freq: Every day | ORAL | Status: DC
  Administered 2021-01-29 – 2021-01-30 (×2): 1 via ORAL
  Filled 2021-01-28 (×2): qty 1

## 2021-01-28 MED ORDER — ENOXAPARIN SODIUM 40 MG/0.4ML IJ SOSY: 40.0000 mg | PREFILLED_SYRINGE | INTRAMUSCULAR | Status: DC

## 2021-01-28 MED ORDER — MORPHINE SULFATE (PF) 2 MG/ML IV SOLN: 2.0000 mg | INTRAVENOUS | Status: DC | PRN

## 2021-01-28 MED ORDER — METOPROLOL TARTRATE 5 MG/5ML IV SOLN
2.5000 mg | Freq: Three times a day (TID) | INTRAVENOUS | Status: DC
  Administered 2021-01-28 – 2021-01-29 (×3): 2.5 mg via INTRAVENOUS
  Filled 2021-01-28 (×3): qty 5

## 2021-01-28 MED ORDER — METOPROLOL TARTRATE 5 MG/5ML IV SOLN: 2.5000 mg | Freq: Two times a day (BID) | INTRAVENOUS | Status: DC

## 2021-01-28 MED ORDER — SODIUM CHLORIDE 0.9 % IV SOLN
2.0000 g | INTRAVENOUS | Status: DC
  Administered 2021-01-29 – 2021-01-30 (×2): 2 g via INTRAVENOUS
  Filled 2021-01-28 (×2): qty 20

## 2021-01-28 MED ORDER — LORAZEPAM 2 MG/ML IJ SOLN: 1.0000 mg | INTRAMUSCULAR | Status: DC | PRN

## 2021-01-28 MED ORDER — FOLIC ACID 5 MG/ML IJ SOLN
1.0000 mg | Freq: Once | INTRAMUSCULAR | Status: AC
  Administered 2021-01-28: 1 mg via INTRAVENOUS
  Filled 2021-01-28: qty 0.2

## 2021-01-28 NOTE — ED Triage Notes (Signed)
Pt arrived via [facility], from home with wife and family. Per wife pt has not eaten well all day, "woke up, yelled, and shook" and has been complaining of a headache and stomach ache. Pt is also normally A&O x4 and has been confused today.

## 2021-01-28 NOTE — ED Notes (Signed)
Pt appears to be having seizure like activity with shaking fixed eyes, and clinched jaw. At this time heart rate is 155. New ekg reading acute MI. Ativan given at this time. Dr. Tyrone Nine in at this time.

## 2021-01-28 NOTE — ED Notes (Signed)
Dr. Tyrone Nine informed of critical Blood Gas results at this time.

## 2021-01-28 NOTE — ED Notes (Signed)
Pt is diaphoretic and clammy. Complaints of frontal headache that has been going on since yesterday.

## 2021-01-28 NOTE — ED Notes (Signed)
MD paged critical value lactic=2.6

## 2021-01-28 NOTE — ED Notes (Signed)
Patient pulled Bi-pap mask off. Mask placed back on patient, Resp called to check Bi-pap settings. Per Resp patient can be removed from bi-pap and placed on 2.5L via [place] due to normal ABG. Patient removed from Bi-pap and placed on O2, resp coming to assess patient.

## 2021-01-28 NOTE — H&P (Signed)
History and Physical    Jon Osborne YBW:[id number] DOB: 02/10/1961 DOA: 01/28/2021  PCP: Ivan Anchors, FNP   Patient coming from: Home  I have personally briefly reviewed patient's old medical records in [place]  Chief Complaint: questionable seizure event; Abdominal pain, nausea and vomiting.  HPI: Jon Osborne is a 60 y.o. male with medical history significant of alcohol abuse, hyperlipidemia, hypertension and a stage IIIb chronic kidney disease; who presented to the hospital with what appears to be a seizure event according to family members.  There is reports of patient waking up suddenly from his sleep and having an episode where he shook all over.  Patient expressed no remembering that episode and during presentation to ED he was slightly confused.  He expressed feeling slightly short of breath, having abdominal discomfort (midepigastric region), 8 out of 10 in intensity, associated with nausea/vomiting, radiating across to the sides and having some intermittent nonproductive coughing spells.  Patient reports no fever, no chest pain, no diarrhea, no dysuria, no hematuria, no sick contacts.  Expressed that he drinks roughly 3 16 ounces beer on daily basis.  COVID screening test negative  ED Course: Patient mentation further improved and no further seizure activity presented.  Patient with increased tachypnea and respiratory distress requiring temporarily the use of BiPAP.  CT chest, abdomen and pelvis demonstrated findings positive for pneumonia and also inflammatory changes consistent with pancreatitis.  Patient with a lipase of 579, WBC 11.7, sodium 123 and a stable creatinine for him of 1.91.  Cultures taken, antibiotics started, IV fluids given and [facility] contacted to place patient in the hospital for further management and management.  Review of Systems: As per HPI otherwise all other systems reviewed and are negative.   Past Medical History:  Diagnosis Date  .  Hypercholesteremia   . Hypertension   . Stage 3b chronic kidney disease ([place]) 01/28/2021    No past surgical history on file.  Social History  reports that he has been smoking. He has never used smokeless tobacco. He reports current alcohol use. He reports that he does not use drugs.  No Known Allergies  Family history: -Patient reports history of hypertension  Prior to Admission medications   Medication Sig Start Date End Date Taking? Authorizing Provider  amoxicillin-clavulanate (AUGMENTIN) 875-125 MG tablet Take 1 tablet by mouth every 12 (twelve) hours. 03/23/18   Evalee Jefferson, PA-C  HYDROcodone-acetaminophen (NORCO/VICODIN) 5-325 MG tablet Take 1 tablet by mouth every 4 (four) hours as needed. 03/23/18   Evalee Jefferson, PA-C  lisinopril (PRINIVIL,ZESTRIL) 40 MG tablet Take 40 mg by mouth daily.    [provider]  ondansetron (ZOFRAN ODT) 4 MG disintegrating tablet Take 1 tablet (4 mg total) by mouth every 6 (six) hours as needed for nausea or vomiting. 12/18/15   Delman Kitten, MD  pravastatin (PRAVACHOL) 40 MG tablet Take 40 mg by mouth daily.    [provider]    Physical Exam: Vitals:   01/28/21 0502 01/28/21 0530 01/28/21 0600 01/28/21 0707  BP: (!) 137/105 (!) 135/117 (!) 156/124 (!) 145/119  Pulse: (!) 110 (!) 109  (!) 110  Resp: (!) 23 19 20 16   Temp:      TempSrc:      SpO2: 100% 100%  100%  Weight:      Height:        Constitutional: Mild distress in the setting of abdominal pain and mild respiratory distress. Vitals:   01/28/21 0502 01/28/21 0530 01/28/21  0600 01/28/21 0707  BP: (!) 137/105 (!) 135/117 (!) 156/124 (!) 145/119  Pulse: (!) 110 (!) 109  (!) 110  Resp: (!) 23 19 20 16   Temp:      TempSrc:      SpO2: 100% 100%  100%  Weight:      Height:       Eyes: PERRL, lids and conjunctivae normal, no icterus, no nystagmus. ENMT: Mucous membranes are moist. Posterior pharynx clear of any exudate or lesions. No thrush Neck: normal, supple, no  masses, no thyromegaly, no JVD Respiratory: No wheezing or crackles on exam; Positive rhonchi bilaterally, positive tachypnea.  During my examination good saturation appreciated while using 2.5-3 L nasal cannula supplementation. Cardiovascular: Sinus tachycardia, no rubs, no gallops, no JVD on exam. Abdomen: Soft, no distention, tender to palpation midepigastric region.  Positive bowel sounds.  No guarding Musculoskeletal: no clubbing / cyanosis. No joint deformity upper and lower extremities. Good ROM, no contractures. Normal muscle tone.  Skin: no rashes, lesions, ulcers. No induration Neurologic: CN 2-12 grossly intact. Sensation intact, DTR normal. Strength 5/5 in all 4.  Psychiatric: Normal judgment and insight. Alert and oriented x 3. Normal mood.    Labs on Admission: I have personally reviewed following labs and imaging studies  CBC: Recent Labs  Lab 01/28/21 0157  WBC 11.7*  NEUTROABS 9.7*  HGB 12.9*  HCT 37.9*  MCV 100.0  PLT 595    Basic Metabolic Panel: Recent Labs  Lab 01/28/21 0157  NA 133*  K 4.0  CL 96*  CO2 24  GLUCOSE 153*  BUN 23*  CREATININE 1.91*  CALCIUM 9.1  MG 1.7  PHOS 3.2    GFR: Estimated Creatinine Clearance: 48.4 mL/min (A) (by C-G formula based on SCr of 1.91 mg/dL (H)).  Liver Function Tests: Recent Labs  Lab 01/28/21 0157  AST 29  ALT 18  ALKPHOS 115  BILITOT 2.0*  PROT 8.5*  ALBUMIN 4.0    Urine analysis:    Component Value Date/Time   COLORURINE YELLOW (A) 12/18/2015 1806   APPEARANCEUR CLOUDY (A) 12/18/2015 1806   LABSPEC 1.020 12/18/2015 1806   PHURINE 5.0 12/18/2015 1806   GLUCOSEU NEGATIVE 12/18/2015 1806   HGBUR 1+ (A) 12/18/2015 1806   BILIRUBINUR NEGATIVE 12/18/2015 1806   KETONESUR NEGATIVE 12/18/2015 1806   PROTEINUR 100 (A) 12/18/2015 1806   NITRITE NEGATIVE 12/18/2015 1806   LEUKOCYTESUR NEGATIVE 12/18/2015 1806    Radiological Exams on Admission: CT HEAD WO CONTRAST  Result Date:  01/28/2021 CLINICAL DATA:  Delirium. Sudden onset tachycardia and hypertension. EXAM: CT HEAD WITHOUT CONTRAST TECHNIQUE: Contiguous axial images were obtained from the base of the skull through the vertex without intravenous contrast. COMPARISON:  None. FINDINGS: Brain: No evidence of large-territorial acute infarction. No parenchymal hemorrhage. No mass lesion. No extra-axial collection. No mass effect or midline shift. No hydrocephalus. Basilar cisterns are patent. Vascular: No hyperdense vessel. Skull: No acute fracture or focal lesion. Sinuses/Orbits: Paranasal sinuses and mastoid air cells are clear. The orbits are unremarkable. Other: Periapical lucencies surrounding a left maxillary teeth. IMPRESSION: 1. No acute intracranial abnormality. 2. Periapical lucencies surrounding a left maxillary teeth. Correlate with physical exam for infection. Electronically Signed   By: Iven Finn M.D.   On: 01/28/2021 03:04   CT Angio Chest PE W and/or Wo Contrast  Result Date: 01/28/2021 CLINICAL DATA:  status post fall 2 days ago. Abdominal pain. Chest pain and shortness of breath. Elevated blood pressure. Rule out acute pulmonary embolus.  EXAM: CT ANGIOGRAPHY CHEST CT ABDOMEN AND PELVIS WITH CONTRAST TECHNIQUE: Multidetector CT imaging of the chest was performed using the standard protocol during bolus administration of intravenous contrast. Multiplanar CT image reconstructions and MIPs were obtained to evaluate the vascular anatomy. Multidetector CT imaging of the abdomen and pelvis was performed using the standard protocol during bolus administration of intravenous contrast. CONTRAST:  170mL OMNIPAQUE IOHEXOL 350 MG/ML SOLN COMPARISON:  CT chest from 02/18/2018 FINDINGS: CTA CHEST FINDINGS Cardiovascular: Exam detail diminished due to respiratory motion artifact. No convincing evidence for acute pulmonary embolus. There is a focal area of low-attenuation attenuation within a segmental branch to the lateral right  lower lobe, image 211/3. This is favored to represent artifact secondary to motion artifact as well as sub optimal pulmonary arterial opacification. Normal heart size. No pericardial effusion. Coronary artery calcifications. Mediastinum/Nodes: Normal appearance of the thyroid gland. The trachea appears patent and is midline. Normal appearance of the esophagus. No supraclavicular, axillary, mediastinal, or hilar adenopathy. Lungs/Pleura: Trace left pleural effusion. Airspace consolidation involving greater than 50% of the left lower lobe with air bronchograms noted. Small nodule within the periphery of the left upper lobe measures 3 mm. New from the previous exam. Musculoskeletal: No chest wall abnormality. No acute or significant osseous findings. Review of the MIP images confirms the above findings. CT ABDOMEN and PELVIS FINDINGS Hepatobiliary: No focal liver abnormality. No gallbladder unremarkable. No signs of gallbladder wall inflammation or bile duct dilatation. Pancreas: There is diffuse pancreatic edema along with peripancreatic fat stranding and a small amount of free fluid. Imaging findings are compatible with acute pancreatitis. No signs of pancreatic necrosis or mature pseudocyst formation. Spleen: Normal in size without focal abnormality. Adrenals/Urinary Tract: Normal appearance of the adrenal glands. No kidney mass or hydronephrosis. Urinary bladder is unremarkable. Stomach/Bowel: Stomach appears normal. The appendix is difficult to visualize separate from the right lower quadrant bowel loops. No secondary signs of acute appendicitis. No bowel wall thickening, inflammation, or distension. Vascular/Lymphatic: Aortic atherosclerosis. No aneurysm. No abdominopelvic adenopathy. Reproductive: Prostate is unremarkable. Other: No discrete fluid collections identified. No ventral abdominal wall hernia. Musculoskeletal: No acute or significant osseous findings. 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  Review of the MIP images confirms the above findings. IMPRESSION: 1. Exam detail is diminished due to respiratory motion artifact. No convincing evidence for acute pulmonary embolus. 2. Left lower lobe airspace consolidation compatible with pneumonia and/or aspiration. Advise follow-up imaging to ensure resolution. 3. Acute pancreatitis. No signs of pancreatic necrosis or mature pseudocyst formation. 4. Aortic atherosclerosis. Coronary artery calcifications. 5. Small nodule within the periphery of the left upper lobe measures 3 mm. No follow-up needed if patient is low-risk. Non-contrast chest CT can be considered in 12 months if patient is high-risk. This recommendation follows the consensus statement: Guidelines for Management of Incidental Pulmonary Nodules Detected on CT Images: From the Fleischner Society 2017; Radiology 2017; 284:228-243. Aortic Atherosclerosis (ICD10-I70.0). Electronically Signed   By: Kerby Moors M.D.   On: 01/28/2021 05:38   CT ABDOMEN PELVIS W CONTRAST  Result Date: 01/28/2021 CLINICAL DATA:  status post fall 2 days ago. Abdominal pain. Chest pain and shortness of breath. Elevated blood pressure. Rule out acute pulmonary embolus. EXAM: CT ANGIOGRAPHY CHEST CT ABDOMEN AND PELVIS WITH CONTRAST TECHNIQUE: Multidetector CT imaging of the chest was performed using the standard protocol during bolus administration of intravenous contrast. Multiplanar CT image reconstructions and MIPs were obtained to evaluate the vascular anatomy. Multidetector CT imaging of the abdomen  and pelvis was performed using the standard protocol during bolus administration of intravenous contrast. CONTRAST:  119mL OMNIPAQUE IOHEXOL 350 MG/ML SOLN COMPARISON:  CT chest from 02/18/2018 FINDINGS: CTA CHEST FINDINGS Cardiovascular: Exam detail diminished due to respiratory motion artifact. No convincing evidence for acute pulmonary embolus. There is a focal area of low-attenuation attenuation within a segmental  branch to the lateral right lower lobe, image 211/3. This is favored to represent artifact secondary to motion artifact as well as sub optimal pulmonary arterial opacification. Normal heart size. No pericardial effusion. Coronary artery calcifications. Mediastinum/Nodes: Normal appearance of the thyroid gland. The trachea appears patent and is midline. Normal appearance of the esophagus. No supraclavicular, axillary, mediastinal, or hilar adenopathy. Lungs/Pleura: Trace left pleural effusion. Airspace consolidation involving greater than 50% of the left lower lobe with air bronchograms noted. Small nodule within the periphery of the left upper lobe measures 3 mm. New from the previous exam. Musculoskeletal: No chest wall abnormality. No acute or significant osseous findings. Review of the MIP images confirms the above findings. CT ABDOMEN and PELVIS FINDINGS Hepatobiliary: No focal liver abnormality. No gallbladder unremarkable. No signs of gallbladder wall inflammation or bile duct dilatation. Pancreas: There is diffuse pancreatic edema along with peripancreatic fat stranding and a small amount of free fluid. Imaging findings are compatible with acute pancreatitis. No signs of pancreatic necrosis or mature pseudocyst formation. Spleen: Normal in size without focal abnormality. Adrenals/Urinary Tract: Normal appearance of the adrenal glands. No kidney mass or hydronephrosis. Urinary bladder is unremarkable. Stomach/Bowel: Stomach appears normal. The appendix is difficult to visualize separate from the right lower quadrant bowel loops. No secondary signs of acute appendicitis. No bowel wall thickening, inflammation, or distension. Vascular/Lymphatic: Aortic atherosclerosis. No aneurysm. No abdominopelvic adenopathy. Reproductive: Prostate is unremarkable. Other: No discrete fluid collections identified. No ventral abdominal wall hernia. Musculoskeletal: No acute or significant osseous findings. Degenerative disc  disease is identified at L5-S1. Review of the MIP images confirms the above findings. IMPRESSION: 1. Exam detail is diminished due to respiratory motion artifact. No convincing evidence for acute pulmonary embolus. 2. Left lower lobe airspace consolidation compatible with pneumonia and/or aspiration. Advise follow-up imaging to ensure resolution. 3. Acute pancreatitis. No signs of pancreatic necrosis or mature pseudocyst formation. 4. Aortic atherosclerosis. Coronary artery calcifications. 5. Small nodule within the periphery of the left upper lobe measures 3 mm. No follow-up needed if patient is low-risk. Non-contrast chest CT can be considered in 12 months if patient is high-risk. This recommendation follows the consensus statement: Guidelines for Management of Incidental Pulmonary Nodules Detected on CT Images: From the Fleischner Society 2017; Radiology 2017; 284:228-243. Aortic Atherosclerosis (ICD10-I70.0). Electronically Signed   By: Kerby Moors M.D.   On: 01/28/2021 05:38   DG Chest Port 1 View  Result Date: 01/28/2021 CLINICAL DATA:  Altered mental status.  Headache seizure. EXAM: PORTABLE CHEST 1 VIEW COMPARISON:  CT chest 02/18/2018 FINDINGS: The heart size and mediastinal contours are within normal limits. Elevated left hemidiaphragm with linear atelectasis. Question retrocardiac opacity which could be related to previously identified nodule on CT chest 02/18/2018. No pulmonary edema. No pleural effusion. No pneumothorax. No acute osseous abnormality. Gaseous distension of the visualized bowel within the left upper abdomen. IMPRESSION: 1. Elevated left hemidiaphragm with linear atelectasis. Question retrocardiac opacity which could be related to previously identified nodule on CT chest 02/18/2018. 2. Gaseous distension of the visualized bowel within the left upper abdomen. Electronically Signed   By: Iven Finn M.D.   On:  01/28/2021 03:02    EKG: Independently reviewed.  Sinus tachycardia  treated  Assessment/Plan 1-Acute respiratory failure with hypoxia In the setting of pneumonia -Community-acquired pneumonia versus aspiration -Patient with nausea/vomiting prior to admission in the setting of acute pancreatitis; there was also concern for seizure event that could have led to hospitalization. -Will provide oxygen supplementation and wean off as tolerated -Follow culture results -Started on Rocephin and Zithromax -As needed bronchodilators and supportive care.  2-acute pancreatitis -In the setting of alcohol abuse -N.p.o. status -IV fluids -As needed antiemetics and analgesics -Follow lipase trend -Alcohol cessation counseling provided.  3-alcohol abuse -Cessation counseling provided -Thiamine and folic acid will be given -CIWA protocol initiated  4-hypertension -Using IV Lopressor while n.p.o. -Follow vital signs.  5-chronic stage IIIb kidney disease -Appears to be close to baseline -Will minimize the use of nephrotoxic agents -Avoid hypotension, contrast and provide adequate hydration  6-anemia chronic kidney disease -No signs of acute overt bleeding -Continue to follow hemoglobin trend  7-GERD/GI prophylaxis -continue PPI  8-hyponatremia -In the setting of chronic alcohol abuse most likely -Fluid retention will be provided -Follow electrolytes trend.   DVT prophylaxis: SCDs Code Status:   Full code Family Communication:  No family at bedside during my evaluation.. Disposition Plan:   Patient is from:         Home  Anticipated DC to:          home  Anticipated DC date:  To be determined  Anticipated DC barriers: Resolution of acute pancreatitis and significant improvement in the treatment of PNA with ongoing respiratory distress. Consults called:  None  Admission status:  Stepdown, inpatient, length of stay more than 2 midnights.  Severity of Illness: Severe illness; patient presented with acute respiratory distress with hypoxia along with  acute pancreatitis process due to alcohol abuse.  In order to prevent further deterioration patient require hospitalization for IV antibiotics, IV fluids, bowel rest and electrolyte repletion.   Barton Dubois MD [facility]  How to contact the [facility] Attending or Consulting provider [place] or covering provider during after hours [place], for this patient?   1. Check the care team in [facility] and look for a) attending/consulting [facility] provider listed and b) the [facility] team listed 2. Log into [email or web address] and use [place]'s universal password to access. If you do not have the password, please contact the hospital operator. 3. Locate the [facility] provider you are looking for under [facility] and page to a number that you can be directly reached. 4. If you still have difficulty reaching the provider, please page the [facility] (Director on Call) for the Hospitalists listed on amion for assistance.  01/28/2021, 7:41 AM

## 2021-01-28 NOTE — ED Provider Notes (Signed)
[facility] EMERGENCY DEPARTMENT Provider Note   CSN: [id number] Arrival date & time: 01/28/21  0139     History Chief Complaint  Patient presents with  . Seizures    alleged    Jon Osborne is a 60 y.o. male.  60 yo M with a chief complaints of an episode that the family thought might of been seizures.  Reported from EMS the patient woke up suddenly from his sleep and had an episode where he shook.  This resolved.  Had been complaining of a headache and some abdominal pain earlier today.  The patient unfortunately is confused and unable to provide much history.  Level 5 caveat.  The history is provided by the patient.  Seizures Illness Severity:  Moderate Onset quality:  Gradual Duration:  1 day Timing:  Constant Progression:  Unchanged Chronicity:  New Associated symptoms: abdominal pain and headaches   Associated symptoms: no chest pain, no congestion, no diarrhea, no fever, no myalgias, no rash, no shortness of breath and no vomiting        Past Medical History:  Diagnosis Date  . Hypercholesteremia   . Hypertension     There are no problems to display for this patient.   No past surgical history on file.     No family history on file.  Social History   Tobacco Use  . Smoking status: Current Every Day Smoker  . Smokeless tobacco: Never Used  Substance Use Topics  . Alcohol use: Yes  . Drug use: No    Home Medications Prior to Admission medications   Medication Sig Start Date End Date Taking? Authorizing Provider  amoxicillin-clavulanate (AUGMENTIN) 875-125 MG tablet Take 1 tablet by mouth every 12 (twelve) hours. 03/23/18   Evalee Jefferson, PA-C  HYDROcodone-acetaminophen (NORCO/VICODIN) 5-325 MG tablet Take 1 tablet by mouth every 4 (four) hours as needed. 03/23/18   Evalee Jefferson, PA-C  lisinopril (PRINIVIL,ZESTRIL) 40 MG tablet Take 40 mg by mouth daily.    [provider]  ondansetron (ZOFRAN ODT) 4 MG disintegrating tablet Take 1 tablet (4 mg  total) by mouth every 6 (six) hours as needed for nausea or vomiting. 12/18/15   Delman Kitten, MD  pravastatin (PRAVACHOL) 40 MG tablet Take 40 mg by mouth daily.    [provider]    Allergies    Patient has no known allergies.  Review of Systems   Review of Systems  Constitutional: Negative for chills and fever.  HENT: Negative for congestion and facial swelling.   Eyes: Negative for discharge and visual disturbance.  Respiratory: Negative for shortness of breath.   Cardiovascular: Negative for chest pain and palpitations.  Gastrointestinal: Positive for abdominal pain. Negative for diarrhea and vomiting.  Musculoskeletal: Negative for arthralgias and myalgias.  Skin: Negative for color change and rash.  Neurological: Positive for seizures and headaches. Negative for tremors and syncope.  Psychiatric/Behavioral: Negative for confusion and dysphoric mood.    Physical Exam Updated Vital Signs BP (!) 135/117   Pulse (!) 109   Temp 98.3 F (36.8 C) (Oral)   Resp 19   Ht 6\' 2"  (1.88 m)   Wt 90.7 kg   SpO2 100%   BMI 25.68 kg/m   Physical Exam Vitals and nursing note reviewed.  Constitutional:      Appearance: He is well-developed.  HENT:     Head: Normocephalic and atraumatic.  Eyes:     Pupils: Pupils are equal, round, and reactive to light.  Neck:  Vascular: No JVD.  Cardiovascular:     Rate and Rhythm: Regular rhythm. Tachycardia present.     Heart sounds: No murmur heard. No friction rub. No gallop.   Pulmonary:     Effort: No respiratory distress.     Breath sounds: No wheezing.  Abdominal:     General: There is no distension.     Tenderness: There is no abdominal tenderness. There is no guarding or rebound.  Musculoskeletal:        General: Normal range of motion.     Cervical back: Normal range of motion and neck supple.  Skin:    Coloration: Skin is not pale.     Findings: No rash.  Neurological:     Mental Status: He is alert.      Comments: Nonsensical speech.  Sometimes will answer yes.  Moving all 4 extremities spontaneously.  Psychiatric:        Behavior: Behavior normal.     ED Results / Procedures / Treatments   Labs (all labs ordered are listed, but only abnormal results are displayed) Labs Reviewed  AMMONIA - Abnormal; Notable for the following components:      Result Value   Ammonia 58 (*)    All other components within normal limits  COMPREHENSIVE METABOLIC PANEL - Abnormal; Notable for the following components:   Sodium 133 (*)    Chloride 96 (*)    Glucose, Bld 153 (*)    BUN 23 (*)    Creatinine, Ser 1.91 (*)    Total Protein 8.5 (*)    Total Bilirubin 2.0 (*)    GFR, Estimated 40 (*)    All other components within normal limits  LACTIC ACID, PLASMA - Abnormal; Notable for the following components:   Lactic Acid, Venous 2.6 (*)    All other components within normal limits  BLOOD GAS, VENOUS - Abnormal; Notable for the following components:   pH, Ven 7.153 (*)    pCO2, Ven 83.5 (*)    pO2, Ven <31.0 (*)    All other components within normal limits  CBC WITH DIFFERENTIAL/PLATELET - Abnormal; Notable for the following components:   WBC 11.7 (*)    RBC 3.79 (*)    Hemoglobin 12.9 (*)    HCT 37.9 (*)    nRBC 1.1 (*)    Neutro Abs 9.7 (*)    Monocytes Absolute 1.1 (*)    All other components within normal limits  LIPASE, BLOOD - Abnormal; Notable for the following components:   Lipase 579 (*)    All other components within normal limits  CULTURE, BLOOD (ROUTINE X 2)  CULTURE, BLOOD (ROUTINE X 2)  RESP PANEL BY RT-PCR (FLU A&B, COVID) ARPGX2  ETHANOL  URINALYSIS, ROUTINE W REFLEX MICROSCOPIC  BLOOD GAS, ARTERIAL  TROPONIN I (HIGH SENSITIVITY)    EKG EKG Interpretation  Date/Time:  Saturday Jan 28 2021 01:45:20 EDT Ventricular Rate:  104 PR Interval:  118 QRS Duration: 99 QT Interval:  358 QTC Calculation: 471 R Axis:   67 Text Interpretation: Sinus tachycardia Abnormal R-wave  progression, early transition Probable LVH with secondary repol abnrm ST depr, consider ischemia, inferior leads No old tracing to compare Confirmed by Deno Etienne [phone]) on 01/28/2021 2:24:50 AM   Radiology CT HEAD WO CONTRAST  Result Date: 01/28/2021 CLINICAL DATA:  Delirium. Sudden onset tachycardia and hypertension. EXAM: CT HEAD WITHOUT CONTRAST TECHNIQUE: Contiguous axial images were obtained from the base of the skull through the vertex without intravenous contrast. COMPARISON:  None. FINDINGS: Brain: No evidence of large-territorial acute infarction. No parenchymal hemorrhage. No mass lesion. No extra-axial collection. No mass effect or midline shift. No hydrocephalus. Basilar cisterns are patent. Vascular: No hyperdense vessel. Skull: No acute fracture or focal lesion. Sinuses/Orbits: Paranasal sinuses and mastoid air cells are clear. The orbits are unremarkable. Other: Periapical lucencies surrounding a left maxillary teeth. IMPRESSION: 1. No acute intracranial abnormality. 2. Periapical lucencies surrounding a left maxillary teeth. Correlate with physical exam for infection. Electronically Signed   By: Iven Finn M.D.   On: 01/28/2021 03:04   CT Angio Chest PE W and/or Wo Contrast  Result Date: 01/28/2021 CLINICAL DATA:  status post fall 2 days ago. Abdominal pain. Chest pain and shortness of breath. Elevated blood pressure. Rule out acute pulmonary embolus. EXAM: CT ANGIOGRAPHY CHEST CT ABDOMEN AND PELVIS WITH CONTRAST TECHNIQUE: Multidetector CT imaging of the chest was performed using the standard protocol during bolus administration of intravenous contrast. Multiplanar CT image reconstructions and MIPs were obtained to evaluate the vascular anatomy. Multidetector CT imaging of the abdomen and pelvis was performed using the standard protocol during bolus administration of intravenous contrast. CONTRAST:  126mL OMNIPAQUE IOHEXOL 350 MG/ML SOLN COMPARISON:  CT chest from 02/18/2018  FINDINGS: CTA CHEST FINDINGS Cardiovascular: Exam detail diminished due to respiratory motion artifact. No convincing evidence for acute pulmonary embolus. There is a focal area of low-attenuation attenuation within a segmental branch to the lateral right lower lobe, image 211/3. This is favored to represent artifact secondary to motion artifact as well as sub optimal pulmonary arterial opacification. Normal heart size. No pericardial effusion. Coronary artery calcifications. Mediastinum/Nodes: Normal appearance of the thyroid gland. The trachea appears patent and is midline. Normal appearance of the esophagus. No supraclavicular, axillary, mediastinal, or hilar adenopathy. Lungs/Pleura: Trace left pleural effusion. Airspace consolidation involving greater than 50% of the left lower lobe with air bronchograms noted. Small nodule within the periphery of the left upper lobe measures 3 mm. New from the previous exam. Musculoskeletal: No chest wall abnormality. No acute or significant osseous findings. Review of the MIP images confirms the above findings. CT ABDOMEN and PELVIS FINDINGS Hepatobiliary: No focal liver abnormality. No gallbladder unremarkable. No signs of gallbladder wall inflammation or bile duct dilatation. Pancreas: There is diffuse pancreatic edema along with peripancreatic fat stranding and a small amount of free fluid. Imaging findings are compatible with acute pancreatitis. No signs of pancreatic necrosis or mature pseudocyst formation. Spleen: Normal in size without focal abnormality. Adrenals/Urinary Tract: Normal appearance of the adrenal glands. No kidney mass or hydronephrosis. Urinary bladder is unremarkable. Stomach/Bowel: Stomach appears normal. The appendix is difficult to visualize separate from the right lower quadrant bowel loops. No secondary signs of acute appendicitis. No bowel wall thickening, inflammation, or distension. Vascular/Lymphatic: Aortic atherosclerosis. No aneurysm. No  abdominopelvic adenopathy. Reproductive: Prostate is unremarkable. Other: No discrete fluid collections identified. No ventral abdominal wall hernia. Musculoskeletal: No acute or significant osseous findings. 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 Review of the MIP images confirms the above findings. IMPRESSION: 1. Exam detail is diminished due to respiratory motion artifact. No convincing evidence for acute pulmonary embolus. 2. Left lower lobe airspace consolidation compatible with pneumonia and/or aspiration. Advise follow-up imaging to ensure resolution. 3. Acute pancreatitis. No signs of pancreatic necrosis or mature pseudocyst formation. 4. Aortic atherosclerosis. Coronary artery calcifications. 5. Small nodule within the periphery of the left upper lobe measures 3 mm. No follow-up needed if patient is low-risk. Non-contrast chest CT can  be considered in 12 months if patient is high-risk. This recommendation follows the consensus statement: Guidelines for Management of Incidental Pulmonary Nodules Detected on CT Images: From the Fleischner Society 2017; Radiology 2017; 284:228-243. Aortic Atherosclerosis (ICD10-I70.0). Electronically Signed   By: Kerby Moors M.D.   On: 01/28/2021 05:38   CT ABDOMEN PELVIS W CONTRAST  Result Date: 01/28/2021 CLINICAL DATA:  status post fall 2 days ago. Abdominal pain. Chest pain and shortness of breath. Elevated blood pressure. Rule out acute pulmonary embolus. EXAM: CT ANGIOGRAPHY CHEST CT ABDOMEN AND PELVIS WITH CONTRAST TECHNIQUE: Multidetector CT imaging of the chest was performed using the standard protocol during bolus administration of intravenous contrast. Multiplanar CT image reconstructions and MIPs were obtained to evaluate the vascular anatomy. Multidetector CT imaging of the abdomen and pelvis was performed using the standard protocol during bolus administration of intravenous contrast. CONTRAST:  124mL OMNIPAQUE IOHEXOL 350 MG/ML SOLN COMPARISON:   CT chest from 02/18/2018 FINDINGS: CTA CHEST FINDINGS Cardiovascular: Exam detail diminished due to respiratory motion artifact. No convincing evidence for acute pulmonary embolus. There is a focal area of low-attenuation attenuation within a segmental branch to the lateral right lower lobe, image 211/3. This is favored to represent artifact secondary to motion artifact as well as sub optimal pulmonary arterial opacification. Normal heart size. No pericardial effusion. Coronary artery calcifications. Mediastinum/Nodes: Normal appearance of the thyroid gland. The trachea appears patent and is midline. Normal appearance of the esophagus. No supraclavicular, axillary, mediastinal, or hilar adenopathy. Lungs/Pleura: Trace left pleural effusion. Airspace consolidation involving greater than 50% of the left lower lobe with air bronchograms noted. Small nodule within the periphery of the left upper lobe measures 3 mm. New from the previous exam. Musculoskeletal: No chest wall abnormality. No acute or significant osseous findings. Review of the MIP images confirms the above findings. CT ABDOMEN and PELVIS FINDINGS Hepatobiliary: No focal liver abnormality. No gallbladder unremarkable. No signs of gallbladder wall inflammation or bile duct dilatation. Pancreas: There is diffuse pancreatic edema along with peripancreatic fat stranding and a small amount of free fluid. Imaging findings are compatible with acute pancreatitis. No signs of pancreatic necrosis or mature pseudocyst formation. Spleen: Normal in size without focal abnormality. Adrenals/Urinary Tract: Normal appearance of the adrenal glands. No kidney mass or hydronephrosis. Urinary bladder is unremarkable. Stomach/Bowel: Stomach appears normal. The appendix is difficult to visualize separate from the right lower quadrant bowel loops. No secondary signs of acute appendicitis. No bowel wall thickening, inflammation, or distension. Vascular/Lymphatic: Aortic  atherosclerosis. No aneurysm. No abdominopelvic adenopathy. Reproductive: Prostate is unremarkable. Other: No discrete fluid collections identified. No ventral abdominal wall hernia. Musculoskeletal: No acute or significant osseous findings. 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 Review of the MIP images confirms the above findings. IMPRESSION: 1. Exam detail is diminished due to respiratory motion artifact. No convincing evidence for acute pulmonary embolus. 2. Left lower lobe airspace consolidation compatible with pneumonia and/or aspiration. Advise follow-up imaging to ensure resolution. 3. Acute pancreatitis. No signs of pancreatic necrosis or mature pseudocyst formation. 4. Aortic atherosclerosis. Coronary artery calcifications. 5. Small nodule within the periphery of the left upper lobe measures 3 mm. No follow-up needed if patient is low-risk. Non-contrast chest CT can be considered in 12 months if patient is high-risk. This recommendation follows the consensus statement: Guidelines for Management of Incidental Pulmonary Nodules Detected on CT Images: From the Fleischner Society 2017; Radiology 2017; 284:228-243. Aortic Atherosclerosis (ICD10-I70.0). Electronically Signed   By: Queen Slough.D.  On: 01/28/2021 05:38   DG Chest Port 1 View  Result Date: 01/28/2021 CLINICAL DATA:  Altered mental status.  Headache seizure. EXAM: PORTABLE CHEST 1 VIEW COMPARISON:  CT chest 02/18/2018 FINDINGS: The heart size and mediastinal contours are within normal limits. Elevated left hemidiaphragm with linear atelectasis. Question retrocardiac opacity which could be related to previously identified nodule on CT chest 02/18/2018. No pulmonary edema. No pleural effusion. No pneumothorax. No acute osseous abnormality. Gaseous distension of the visualized bowel within the left upper abdomen. IMPRESSION: 1. Elevated left hemidiaphragm with linear atelectasis. Question retrocardiac opacity which could be related  to previously identified nodule on CT chest 02/18/2018. 2. Gaseous distension of the visualized bowel within the left upper abdomen. Electronically Signed   By: Iven Finn M.D.   On: 01/28/2021 03:02    Procedures Procedures   Medications Ordered in ED Medications  cefTRIAXone (ROCEPHIN) 2 g in sodium chloride 0.9 % 100 mL IVPB (has no administration in time range)  azithromycin (ZITHROMAX) 500 mg in sodium chloride 0.9 % 250 mL IVPB (has no administration in time range)  sodium chloride 0.9 % bolus 1,000 mL (1,000 mLs Intravenous New Bag/Given 01/28/21 0210)  LORazepam (ATIVAN) injection 1 mg (1 mg Intravenous Given 01/28/21 0238)  labetalol (NORMODYNE) injection 10 mg (10 mg Intravenous Given 01/28/21 0252)  iohexol (OMNIPAQUE) 350 MG/ML injection 100 mL (100 mLs Intravenous Contrast Given 01/28/21 0455)  sodium chloride 0.9 % bolus 1,000 mL (1,000 mLs Intravenous New Bag/Given 01/28/21 0544)    ED Course  I have reviewed the triage vital signs and the nursing notes.  Pertinent labs & imaging results that were available during my care of the patient were reviewed by me and considered in my medical decision making (see chart for details).    MDM Rules/Calculators/A&P                          60 yo M with a chief complaints of what was thought to be seizure activity by his family.  Patient unfortunately is unable provide much history and seems acutely confused.  Could be postictal.  Had another event since he has been in the ED where he became diffusely stiff worse on the right than the left and was nonverbal.  We will give a dose of Ativan and reassess.  Patient with some mild improvements but now profoundly hypertensive.  Hard to tell if that was because of his shaking event or separate.  We will give 1 bolus dose of labetalol.  VBG with hypercarbia started on BiPAP.  CT of the head without obvious finding.  Will obtain CT scan of the chest abdomen and pelvis.  Wife has arrived  and provides further history states that the patient actually has been feeling bad since Monday has been having abdominal pain and headaches.  Has not really been eating and drinking all that much.  She was worried that he may be dehydrated.  Started become more confused over the past 48 hours or so.  Had an episode where he sat up suddenly in the bed and kind of shook all over.  She then decided to have him sent by EMS here.  CT scan concerning for possible left lower lobe pneumonia.  Will start on antibiotics.  Lab work and CT imaging also consistent with acute pancreatitis.  Patient is having some improvements of his mental status.  Will discuss with medicine for possible admission.   CRITICAL CARE Performed  by: Cecilio Asper   Total critical care time: 80 minutes  Critical care time was exclusive of separately billable procedures and treating other patients.  Critical care was necessary to treat or prevent imminent or life-threatening deterioration.  Critical care was time spent personally by me on the following activities: development of treatment plan with patient and/or surrogate as well as nursing, discussions with consultants, evaluation of patient's response to treatment, examination of patient, obtaining history from patient or surrogate, ordering and performing treatments and interventions, ordering and review of laboratory studies, ordering and review of radiographic studies, pulse oximetry and re-evaluation of patient's condition.  The patients results and plan were reviewed and discussed.   Any x-rays performed were independently reviewed by myself.   Differential diagnosis were considered with the presenting HPI.  Medications  cefTRIAXone (ROCEPHIN) 2 g in sodium chloride 0.9 % 100 mL IVPB (has no administration in time range)  azithromycin (ZITHROMAX) 500 mg in sodium chloride 0.9 % 250 mL IVPB (has no administration in time range)  sodium chloride 0.9 % bolus 1,000  mL (1,000 mLs Intravenous New Bag/Given 01/28/21 0210)  LORazepam (ATIVAN) injection 1 mg (1 mg Intravenous Given 01/28/21 0238)  labetalol (NORMODYNE) injection 10 mg (10 mg Intravenous Given 01/28/21 0252)  iohexol (OMNIPAQUE) 350 MG/ML injection 100 mL (100 mLs Intravenous Contrast Given 01/28/21 0455)  sodium chloride 0.9 % bolus 1,000 mL (1,000 mLs Intravenous New Bag/Given 01/28/21 0544)    Vitals:   01/28/21 0409 01/28/21 0451 01/28/21 0502 01/28/21 0530  BP: (!) 139/107  (!) 137/105 (!) 135/117  Pulse: (!) 114 (!) 108 (!) 110 (!) 109  Resp: (!) 29 17 (!) 23 19  Temp:      TempSrc:      SpO2: 99% 100% 100% 100%  Weight:      Height:        Final diagnoses:  Acute pancreatitis, unspecified complication status, unspecified pancreatitis type  Pneumonia of left lower lobe due to infectious organism    Admission/ observation were discussed with the admitting physician, patient and/or family and they are comfortable with the plan.    Final Clinical Impression(s) / ED Diagnoses Final diagnoses:  Acute pancreatitis, unspecified complication status, unspecified pancreatitis type  Pneumonia of left lower lobe due to infectious organism    Rx / DC Orders ED Discharge Orders    None       Deno Etienne, DO 01/28/21 7510

## 2021-01-28 NOTE — ED Notes (Signed)
Pt is restless, attempting to remove all lines and cords. Pt cannot answer any questions at this time.

## 2021-01-28 NOTE — Progress Notes (Signed)
Patient resting at this time on 2.5lpm [place]. No need for Bipap right now.

## 2021-01-28 NOTE — ED Notes (Signed)
Hospitalist in room to assess patient prior to transport to floor.

## 2021-01-28 NOTE — ED Notes (Signed)
Wife given update.

## 2021-01-29 DIAGNOSIS — I10 Essential (primary) hypertension: Secondary | ICD-10-CM

## 2021-01-29 DIAGNOSIS — K852 Alcohol induced acute pancreatitis without necrosis or infection: Principal | ICD-10-CM

## 2021-01-29 LAB — CBC
HCT: 36.9 % — ABNORMAL LOW (ref 39.0–52.0)
Hemoglobin: 11.8 g/dL — ABNORMAL LOW (ref 13.0–17.0)
MCH: 34.2 pg — ABNORMAL HIGH (ref 26.0–34.0)
MCHC: 32 g/dL (ref 30.0–36.0)
MCV: 107 fL — ABNORMAL HIGH (ref 80.0–100.0)
Platelets: 247 10*3/uL (ref 150–400)
RBC: 3.45 MIL/uL — ABNORMAL LOW (ref 4.22–5.81)
RDW: 14.9 % (ref 11.5–15.5)
WBC: 11.4 10*3/uL — ABNORMAL HIGH (ref 4.0–10.5)
nRBC: 0.5 % — ABNORMAL HIGH (ref 0.0–0.2)

## 2021-01-29 LAB — COMPREHENSIVE METABOLIC PANEL
ALT: 15 U/L (ref 0–44)
AST: 33 U/L (ref 15–41)
Albumin: 3.1 g/dL — ABNORMAL LOW (ref 3.5–5.0)
Alkaline Phosphatase: 84 U/L (ref 38–126)
Anion gap: 11 (ref 5–15)
BUN: 20 mg/dL (ref 6–20)
CO2: 22 mmol/L (ref 22–32)
Calcium: 8.5 mg/dL — ABNORMAL LOW (ref 8.9–10.3)
Chloride: 102 mmol/L (ref 98–111)
Creatinine, Ser: 1.67 mg/dL — ABNORMAL HIGH (ref 0.61–1.24)
GFR, Estimated: 47 mL/min — ABNORMAL LOW (ref >60)
Glucose, Bld: 61 mg/dL — ABNORMAL LOW (ref 70–99)
Potassium: 4.3 mmol/L (ref 3.5–5.1)
Sodium: 135 mmol/L (ref 135–145)
Total Bilirubin: 1.4 mg/dL — ABNORMAL HIGH (ref 0.3–1.2)
Total Protein: 6.7 g/dL (ref 6.5–8.1)

## 2021-01-29 LAB — GLUCOSE, CAPILLARY
Glucose-Capillary: 56 mg/dL — ABNORMAL LOW (ref 70–99)
Glucose-Capillary: 91 mg/dL (ref 70–99)

## 2021-01-29 LAB — VITAMIN B12: Vitamin B-12: 279 pg/mL (ref 180–914)

## 2021-01-29 LAB — STREP PNEUMONIAE URINARY ANTIGEN: Strep Pneumo Urinary Antigen: NEGATIVE

## 2021-01-29 LAB — HIV ANTIBODY (ROUTINE TESTING W REFLEX): HIV Screen 4th Generation wRfx: NONREACTIVE

## 2021-01-29 LAB — LIPASE, BLOOD: Lipase: 375 U/L — ABNORMAL HIGH (ref 11–51)

## 2021-01-29 LAB — LACTIC ACID, PLASMA: Lactic Acid, Venous: 0.9 mmol/L (ref 0.5–1.9)

## 2021-01-29 MED ORDER — AMLODIPINE BESYLATE 5 MG PO TABS
5.0000 mg | ORAL_TABLET | Freq: Every day | ORAL | Status: DC
  Administered 2021-01-29 – 2021-01-30 (×2): 5 mg via ORAL
  Filled 2021-01-29 (×2): qty 1

## 2021-01-29 MED ORDER — VITAMIN B-12 1000 MCG PO TABS
1000.0000 ug | ORAL_TABLET | Freq: Every day | ORAL | Status: DC
  Administered 2021-01-29 – 2021-01-30 (×2): 1000 ug via ORAL
  Filled 2021-01-29 (×2): qty 1

## 2021-01-29 MED ORDER — SODIUM CHLORIDE 0.9 % IV SOLN: INTRAVENOUS | Status: DC

## 2021-01-29 MED ORDER — PANTOPRAZOLE SODIUM 40 MG PO TBEC
40.0000 mg | DELAYED_RELEASE_TABLET | Freq: Every day | ORAL | Status: DC
  Administered 2021-01-29 – 2021-01-30 (×2): 40 mg via ORAL
  Filled 2021-01-29 (×2): qty 1

## 2021-01-29 NOTE — Progress Notes (Signed)
PROGRESS NOTE    Jon Osborne  OEV:[id number] DOB: May 24, 1961 DOA: 01/28/2021 PCP: Ivan Anchors, FNP   Chief Complaint  Patient presents with  . Seizures    alleged    Brief admission narrative:  Jon Osborne is a 60 y.o. male with medical history significant of alcohol abuse, hyperlipidemia, hypertension and a stage IIIb chronic kidney disease; who presented to the hospital with what appears to be a seizure event according to family members.  There is reports of patient waking up suddenly from his sleep and having an episode where he shook all over.  Patient expressed no remembering that episode and during presentation to ED he was slightly confused.  He expressed feeling slightly short of breath, having abdominal discomfort (midepigastric region), 8 out of 10 in intensity, associated with nausea/vomiting, radiating across to the sides and having some intermittent nonproductive coughing spells.  Patient reports no fever, no chest pain, no diarrhea, no dysuria, no hematuria, no sick contacts.  Expressed that he drinks roughly 3 16 ounces beer on daily basis.  COVID screening test negative  ED Course: Patient mentation further improved and no further seizure activity presented.  Patient with increased tachypnea and respiratory distress requiring temporarily the use of BiPAP.  CT chest, abdomen and pelvis demonstrated findings positive for pneumonia and also inflammatory changes consistent with pancreatitis.  Patient with a lipase of 579, WBC 11.7, sodium 123 and a stable creatinine for him of 1.91.  Cultures taken, antibiotics started, IV fluids given and TRH contacted to place patient in the hospital for further management and management.  Assessment & Plan: 1-acute respiratory failure with hypoxia in the setting of pneumonia -Community-acquired versus aspiration -Currently afebrile -No further requiring oxygen supplementation -No tachypnea appreciated on exam -Continue IV  antibiotics while assessing oral tolerance prior to transition medications. -Will plan for a 5 days antibiotic therapy. -Continue as needed bronchodilators and supportive care.  2-acute alcohol pancreatitis -Lipase trending down -Patient expressed no nausea or vomiting -Slowly will start advancing diet -Continue to adjust IVF rate -Continue as needed analgesics.  3-alcohol abuse -Cessation counseling provided -Continue CIWA protocol, thiamine and folic acid. -No active withdrawal currently.  4-hypertension -Low-dose amlodipine will be started -Follow vital signs. -Alcohol cessation once again encouraged.  5-chronic kidney disease a stage IIIb -At baseline -Maintain adequate hydration and minimize nephrotoxic agents usage.  6-anemia of chronic kidney disease -No signs of overt bleeding -Hemoglobin at baseline -B12 borderline normal; oral daily supplementation will be started.  7-gastroesophageal flux disease -Continue PPI  8-hyponatremia -Improve and with electrolytes within normal limits currently -Continue to follow trend. -Continue to maintain adequate hydration.   DVT prophylaxis: SCDs Code Status: Full code Family Communication: No family at bedside. Disposition:   Status is: Inpatient  Dispo: The patient is from: Home              Anticipated d/c is to: Home              Patient currently still not medically stable for discharge; diet will be advanced and tolerance will be assessed; continue IV antibiotics, patient will be transferred out of stepdown unit while we continue IV fluids and supportive care. Hopefully discharge in the next 24 hours..   Difficult to place patient no    Consultants:   None   Procedures:  See below for x-ray reports.   Antimicrobials:  Rocephin and Zithromax.   Subjective: Currently afebrile, reports no chest pain, no nausea, no vomiting, no using  accessory muscles. Off oxygen supplementation and will like to have diet  advanced.  No withdrawal symptoms appreciated.  Objective: Vitals:   01/29/21 1000 01/29/21 1100 01/29/21 1200 01/29/21 1259  BP: (!) 144/103 (!) 142/106 (!) 146/103   Pulse: (!) 102 (!) 102 100 (!) 102  Resp: 20 18 (!) 22 19  Temp:      TempSrc:      SpO2: 100% 99% 99% 99%  Weight:      Height:        Intake/Output Summary (Last 24 hours) at 01/29/2021 1354 Last data filed at 01/29/2021 0719 Gross per 24 hour  Intake 2142.72 ml  Output 725 ml  Net 1417.72 ml   Filed Weights   01/28/21 0152 01/29/21 0403  Weight: 90.7 kg 85.5 kg    Examination:  General exam: Afebrile, no chest pain, no nausea, no vomiting; reports to be hungry and expressed no abdominal pain currently.  Patient is not requiring oxygen supplementation at this time. Respiratory system: Positive scattered rhonchi, no wheezing, no using accessory muscles. Cardiovascular system: Mild sinus tachycardia, no rubs, no gallops, no murmurs on exam. Gastrointestinal system: Abdomen is nondistended, soft and nontender. No organomegaly or masses felt. Normal bowel sounds heard. Central nervous system: Alert and oriented X3. No focal neurological deficits. Extremities: No cyanosis or clubbing. Skin: No rashes, no petechiae. Psychiatry: Judgement and insight appear normal. Mood & affect appropriate.     Data Reviewed: I have personally reviewed following labs and imaging studies  CBC: Recent Labs  Lab 01/28/21 0157 01/28/21 1905 01/29/21 0510  WBC 11.7* 12.7* 11.4*  NEUTROABS 9.7*  --   --   HGB 12.9* 11.2* 11.8*  HCT 37.9* 33.6* 36.9*  MCV 100.0 103.1* 107.0*  PLT 340 286 570    Basic Metabolic Panel: Recent Labs  Lab 01/28/21 0157 01/28/21 0749 01/28/21 1905 01/29/21 0510  NA 133*  --  135 135  K 4.0  --  4.4 4.3  CL 96*  --  104 102  CO2 24  --  20* 22  GLUCOSE 153*  --  77 61*  BUN 23*  --  21* 20  CREATININE 1.91*  --  1.66* 1.67*  CALCIUM 9.1  --  8.4* 8.5*  MG 1.7  --  1.9  --   PHOS 3.2  3.2 2.5  --     GFR: Estimated Creatinine Clearance: 55.4 mL/min (A) (by C-G formula based on SCr of 1.67 mg/dL (H)).  Liver Function Tests: Recent Labs  Lab 01/28/21 0157 01/28/21 1905 01/29/21 0510  AST 29 30 33  ALT 18 11 15   ALKPHOS 115 88 84  BILITOT 2.0* 1.1 1.4*  PROT 8.5* 6.8 6.7  ALBUMIN 4.0 3.3* 3.1*    CBG: Recent Labs  Lab 01/29/21 0814 01/29/21 1128  GLUCAP 56* 91     Recent Results (from the past 240 hour(s))  Resp Panel by RT-PCR (Flu A&B, Covid) Nasopharyngeal Swab     Status: None   Collection Time: 01/28/21  3:00 AM   Specimen: Nasopharyngeal Swab; Nasopharyngeal(NP) swabs in vial transport medium  Result Value Ref Range Status   SARS Coronavirus 2 by RT PCR NEGATIVE NEGATIVE Final    Comment: (NOTE) SARS-CoV-2 target nucleic acids are NOT DETECTED.  The SARS-CoV-2 RNA is generally detectable in upper respiratory specimens during the acute phase of infection. The lowest concentration of SARS-CoV-2 viral copies this assay can detect is 138 copies/mL. A negative result does not preclude SARS-Cov-2 infection and should  not be used as the sole basis for treatment or other patient management decisions. A negative result may occur with  improper specimen collection/handling, submission of specimen other than nasopharyngeal swab, presence of viral mutation(s) within the areas targeted by this assay, and inadequate number of viral copies(<138 copies/mL). A negative result must be combined with clinical observations, patient history, and epidemiological information. The expected result is Negative.  Fact Sheet for Patients:  EntrepreneurPulse.com.au  Fact Sheet for Healthcare Providers:  IncredibleEmployment.be  This test is no t yet approved or cleared by the [place] FDA and  has been authorized for detection and/or diagnosis of SARS-CoV-2 by FDA under an Emergency Use Authorization (EUA). This EUA will  remain  in effect (meaning this test can be used) for the duration of the COVID-19 declaration under Section 564(b)(1) of the Act, 21 U.S.C.section 360bbb-3(b)(1), unless the authorization is terminated  or revoked sooner.       Influenza A by PCR NEGATIVE NEGATIVE Final   Influenza B by PCR NEGATIVE NEGATIVE Final    Comment: (NOTE) The Xpert Xpress SARS-CoV-2/FLU/RSV plus assay is intended as an aid in the diagnosis of influenza from Nasopharyngeal swab specimens and should not be used as a sole basis for treatment. Nasal washings and aspirates are unacceptable for Xpert Xpress SARS-CoV-2/FLU/RSV testing.  Fact Sheet for Patients: EntrepreneurPulse.com.au  Fact Sheet for Healthcare Providers: IncredibleEmployment.be  This test is not yet approved or cleared by the [place] FDA and has been authorized for detection and/or diagnosis of SARS-CoV-2 by FDA under an Emergency Use Authorization (EUA). This EUA will remain in effect (meaning this test can be used) for the duration of the COVID-19 declaration under Section 564(b)(1) of the Act, 21 U.S.C. section 360bbb-3(b)(1), unless the authorization is terminated or revoked.  Performed at [facility], [address], [place], [place] [address]   Blood Cultures (routine x 2)     Status: None (Preliminary result)   Collection Time: 01/28/21  3:16 AM   Specimen: BLOOD  Result Value Ref Range Status   Specimen Description BLOOD BLOOD LEFT ARM  Final   Special Requests   Final    Blood Culture adequate volume BOTTLES DRAWN AEROBIC AND ANAEROBIC   Culture   Final    NO GROWTH < 12 HOURS Performed at [facility], [address] [place], [place] [address]    Report Status PENDING  Incomplete  Blood Cultures (routine x 2)     Status: None (Preliminary result)   Collection Time: 01/28/21  3:23 AM   Specimen: BLOOD  Result Value Ref Range Status   Specimen Description BLOOD BLOOD LEFT HAND   Final   Special Requests   Final    Blood Culture results may not be optimal due to an inadequate volume of blood received in culture bottles BOTTLES DRAWN AEROBIC ONLY   Culture   Final    NO GROWTH < 12 HOURS Performed at [facility], [address], [place], [place] [address]    Report Status PENDING  Incomplete  MRSA PCR Screening     Status: None   Collection Time: 01/28/21 10:00 AM   Specimen: Nasal Mucosa; Nasopharyngeal  Result Value Ref Range Status   MRSA by PCR NEGATIVE NEGATIVE Final    Comment:        The GeneXpert MRSA Assay (FDA approved for NASAL specimens only), is one component of a comprehensive MRSA colonization surveillance program. It is not intended to diagnose MRSA infection nor to guide  or monitor treatment for MRSA infections. Performed at [facility], [address] [place], [place] [address]      Radiology Studies: CT HEAD WO CONTRAST  Result Date: 01/28/2021 CLINICAL DATA:  Delirium. Sudden onset tachycardia and hypertension. EXAM: CT HEAD WITHOUT CONTRAST TECHNIQUE: Contiguous axial images were obtained from the base of the skull through the vertex without intravenous contrast. COMPARISON:  None. FINDINGS: Brain: No evidence of large-territorial acute infarction. No parenchymal hemorrhage. No mass lesion. No extra-axial collection. No mass effect or midline shift. No hydrocephalus. Basilar cisterns are patent. Vascular: No hyperdense vessel. Skull: No acute fracture or focal lesion. Sinuses/Orbits: Paranasal sinuses and mastoid air cells are clear. The orbits are unremarkable. Other: Periapical lucencies surrounding a left maxillary teeth. IMPRESSION: 1. No acute intracranial abnormality. 2. Periapical lucencies surrounding a left maxillary teeth. Correlate with physical exam for infection. Electronically Signed   By: Iven Finn M.D.   On: 01/28/2021 03:04   CT Angio Chest PE W and/or Wo Contrast  Result Date: 01/28/2021 CLINICAL DATA:  status  post fall 2 days ago. Abdominal pain. Chest pain and shortness of breath. Elevated blood pressure. Rule out acute pulmonary embolus. EXAM: CT ANGIOGRAPHY CHEST CT ABDOMEN AND PELVIS WITH CONTRAST TECHNIQUE: Multidetector CT imaging of the chest was performed using the standard protocol during bolus administration of intravenous contrast. Multiplanar CT image reconstructions and MIPs were obtained to evaluate the vascular anatomy. Multidetector CT imaging of the abdomen and pelvis was performed using the standard protocol during bolus administration of intravenous contrast. CONTRAST:  116mL OMNIPAQUE IOHEXOL 350 MG/ML SOLN COMPARISON:  CT chest from 02/18/2018 FINDINGS: CTA CHEST FINDINGS Cardiovascular: Exam detail diminished due to respiratory motion artifact. No convincing evidence for acute pulmonary embolus. There is a focal area of low-attenuation attenuation within a segmental branch to the lateral right lower lobe, image 211/3. This is favored to represent artifact secondary to motion artifact as well as sub optimal pulmonary arterial opacification. Normal heart size. No pericardial effusion. Coronary artery calcifications. Mediastinum/Nodes: Normal appearance of the thyroid gland. The trachea appears patent and is midline. Normal appearance of the esophagus. No supraclavicular, axillary, mediastinal, or hilar adenopathy. Lungs/Pleura: Trace left pleural effusion. Airspace consolidation involving greater than 50% of the left lower lobe with air bronchograms noted. Small nodule within the periphery of the left upper lobe measures 3 mm. New from the previous exam. Musculoskeletal: No chest wall abnormality. No acute or significant osseous findings. Review of the MIP images confirms the above findings. CT ABDOMEN and PELVIS FINDINGS Hepatobiliary: No focal liver abnormality. No gallbladder unremarkable. No signs of gallbladder wall inflammation or bile duct dilatation. Pancreas: There is diffuse pancreatic edema  along with peripancreatic fat stranding and a small amount of free fluid. Imaging findings are compatible with acute pancreatitis. No signs of pancreatic necrosis or mature pseudocyst formation. Spleen: Normal in size without focal abnormality. Adrenals/Urinary Tract: Normal appearance of the adrenal glands. No kidney mass or hydronephrosis. Urinary bladder is unremarkable. Stomach/Bowel: Stomach appears normal. The appendix is difficult to visualize separate from the right lower quadrant bowel loops. No secondary signs of acute appendicitis. No bowel wall thickening, inflammation, or distension. Vascular/Lymphatic: Aortic atherosclerosis. No aneurysm. No abdominopelvic adenopathy. Reproductive: Prostate is unremarkable. Other: No discrete fluid collections identified. No ventral abdominal wall hernia. Musculoskeletal: No acute or significant osseous findings. 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 Review of the MIP images confirms the above findings. IMPRESSION: 1. Exam detail is diminished due to respiratory motion artifact. No  convincing evidence for acute pulmonary embolus. 2. Left lower lobe airspace consolidation compatible with pneumonia and/or aspiration. Advise follow-up imaging to ensure resolution. 3. Acute pancreatitis. No signs of pancreatic necrosis or mature pseudocyst formation. 4. Aortic atherosclerosis. Coronary artery calcifications. 5. Small nodule within the periphery of the left upper lobe measures 3 mm. No follow-up needed if patient is low-risk. Non-contrast chest CT can be considered in 12 months if patient is high-risk. This recommendation follows the consensus statement: Guidelines for Management of Incidental Pulmonary Nodules Detected on CT Images: From the Fleischner Society 2017; Radiology 2017; 284:228-243. Aortic Atherosclerosis (ICD10-I70.0). Electronically Signed   By: Kerby Moors M.D.   On: 01/28/2021 05:38   CT ABDOMEN PELVIS W CONTRAST  Result Date:  01/28/2021 CLINICAL DATA:  status post fall 2 days ago. Abdominal pain. Chest pain and shortness of breath. Elevated blood pressure. Rule out acute pulmonary embolus. EXAM: CT ANGIOGRAPHY CHEST CT ABDOMEN AND PELVIS WITH CONTRAST TECHNIQUE: Multidetector CT imaging of the chest was performed using the standard protocol during bolus administration of intravenous contrast. Multiplanar CT image reconstructions and MIPs were obtained to evaluate the vascular anatomy. Multidetector CT imaging of the abdomen and pelvis was performed using the standard protocol during bolus administration of intravenous contrast. CONTRAST:  141mL OMNIPAQUE IOHEXOL 350 MG/ML SOLN COMPARISON:  CT chest from 02/18/2018 FINDINGS: CTA CHEST FINDINGS Cardiovascular: Exam detail diminished due to respiratory motion artifact. No convincing evidence for acute pulmonary embolus. There is a focal area of low-attenuation attenuation within a segmental branch to the lateral right lower lobe, image 211/3. This is favored to represent artifact secondary to motion artifact as well as sub optimal pulmonary arterial opacification. Normal heart size. No pericardial effusion. Coronary artery calcifications. Mediastinum/Nodes: Normal appearance of the thyroid gland. The trachea appears patent and is midline. Normal appearance of the esophagus. No supraclavicular, axillary, mediastinal, or hilar adenopathy. Lungs/Pleura: Trace left pleural effusion. Airspace consolidation involving greater than 50% of the left lower lobe with air bronchograms noted. Small nodule within the periphery of the left upper lobe measures 3 mm. New from the previous exam. Musculoskeletal: No chest wall abnormality. No acute or significant osseous findings. Review of the MIP images confirms the above findings. CT ABDOMEN and PELVIS FINDINGS Hepatobiliary: No focal liver abnormality. No gallbladder unremarkable. No signs of gallbladder wall inflammation or bile duct dilatation. Pancreas:  There is diffuse pancreatic edema along with peripancreatic fat stranding and a small amount of free fluid. Imaging findings are compatible with acute pancreatitis. No signs of pancreatic necrosis or mature pseudocyst formation. Spleen: Normal in size without focal abnormality. Adrenals/Urinary Tract: Normal appearance of the adrenal glands. No kidney mass or hydronephrosis. Urinary bladder is unremarkable. Stomach/Bowel: Stomach appears normal. The appendix is difficult to visualize separate from the right lower quadrant bowel loops. No secondary signs of acute appendicitis. No bowel wall thickening, inflammation, or distension. Vascular/Lymphatic: Aortic atherosclerosis. No aneurysm. No abdominopelvic adenopathy. Reproductive: Prostate is unremarkable. Other: No discrete fluid collections identified. No ventral abdominal wall hernia. Musculoskeletal: No acute or significant osseous findings. 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 Review of the MIP images confirms the above findings. IMPRESSION: 1. Exam detail is diminished due to respiratory motion artifact. No convincing evidence for acute pulmonary embolus. 2. Left lower lobe airspace consolidation compatible with pneumonia and/or aspiration. Advise follow-up imaging to ensure resolution. 3. Acute pancreatitis. No signs of pancreatic necrosis or mature pseudocyst formation. 4. Aortic atherosclerosis. Coronary artery calcifications. 5. Small nodule within the periphery of  the left upper lobe measures 3 mm. No follow-up needed if patient is low-risk. Non-contrast chest CT can be considered in 12 months if patient is high-risk. This recommendation follows the consensus statement: Guidelines for Management of Incidental Pulmonary Nodules Detected on CT Images: From the Fleischner Society 2017; Radiology 2017; 284:228-243. Aortic Atherosclerosis (ICD10-I70.0). Electronically Signed   By: Kerby Moors M.D.   On: 01/28/2021 05:38   DG Chest Port 1  View  Result Date: 01/28/2021 CLINICAL DATA:  Altered mental status.  Headache seizure. EXAM: PORTABLE CHEST 1 VIEW COMPARISON:  CT chest 02/18/2018 FINDINGS: The heart size and mediastinal contours are within normal limits. Elevated left hemidiaphragm with linear atelectasis. Question retrocardiac opacity which could be related to previously identified nodule on CT chest 02/18/2018. No pulmonary edema. No pleural effusion. No pneumothorax. No acute osseous abnormality. Gaseous distension of the visualized bowel within the left upper abdomen. IMPRESSION: 1. Elevated left hemidiaphragm with linear atelectasis. Question retrocardiac opacity which could be related to previously identified nodule on CT chest 02/18/2018. 2. Gaseous distension of the visualized bowel within the left upper abdomen. Electronically Signed   By: Iven Finn M.D.   On: 01/28/2021 03:02   Scheduled Meds: . amLODipine  5 mg Oral Daily  . Chlorhexidine Gluconate Cloth  6 each Topical Daily  . dextromethorphan-guaiFENesin  1 tablet Oral BID  . folic acid  1 mg Oral Daily  . LORazepam  0-4 mg Intravenous Q6H   Followed by  . [START ON 01/30/2021] LORazepam  0-4 mg Intravenous Q12H  . metoprolol tartrate  2.5 mg Intravenous Q8H  . multivitamin with minerals  1 tablet Oral Daily  . pantoprazole (PROTONIX) IV  40 mg Intravenous Q24H  . thiamine  100 mg Oral Daily   Or  . thiamine  100 mg Intravenous Daily  . vitamin B-12  1,000 mcg Oral Daily   Continuous Infusions: . sodium chloride 125 mL/hr at 01/29/21 0719  . azithromycin Stopped (01/29/21 3729)  . cefTRIAXone (ROCEPHIN)  IV Stopped (01/29/21 0539)     LOS: 1 day    Time spent:35 minutes   Barton Dubois, MD [facility]   To contact the attending provider between 7A-7P or the covering provider during after hours 7P-7A, please log into the web site [email or web address] and access using universal [place] password for that web site. If you do not have the  password, please call the hospital operator.  01/29/2021, 1:54 PM

## 2021-01-30 DIAGNOSIS — F1010 Alcohol abuse, uncomplicated: Secondary | ICD-10-CM

## 2021-01-30 DIAGNOSIS — I10 Essential (primary) hypertension: Secondary | ICD-10-CM

## 2021-01-30 DIAGNOSIS — J189 Pneumonia, unspecified organism: Secondary | ICD-10-CM

## 2021-01-30 DIAGNOSIS — K219 Gastro-esophageal reflux disease without esophagitis: Secondary | ICD-10-CM

## 2021-01-30 DIAGNOSIS — D631 Anemia in chronic kidney disease: Secondary | ICD-10-CM

## 2021-01-30 LAB — CBC
HCT: 32.1 % — ABNORMAL LOW (ref 39.0–52.0)
Hemoglobin: 10.5 g/dL — ABNORMAL LOW (ref 13.0–17.0)
MCH: 33.8 pg (ref 26.0–34.0)
MCHC: 32.7 g/dL (ref 30.0–36.0)
MCV: 103.2 fL — ABNORMAL HIGH (ref 80.0–100.0)
Platelets: 266 10*3/uL (ref 150–400)
RBC: 3.11 MIL/uL — ABNORMAL LOW (ref 4.22–5.81)
RDW: 14.7 % (ref 11.5–15.5)
WBC: 10.2 10*3/uL (ref 4.0–10.5)
nRBC: 0 % (ref 0.0–0.2)

## 2021-01-30 LAB — BASIC METABOLIC PANEL
Anion gap: 7 (ref 5–15)
BUN: 18 mg/dL (ref 6–20)
CO2: 23 mmol/L (ref 22–32)
Calcium: 8.2 mg/dL — ABNORMAL LOW (ref 8.9–10.3)
Chloride: 103 mmol/L (ref 98–111)
Creatinine, Ser: 1.62 mg/dL — ABNORMAL HIGH (ref 0.61–1.24)
GFR, Estimated: 49 mL/min — ABNORMAL LOW (ref >60)
Glucose, Bld: 80 mg/dL (ref 70–99)
Potassium: 3.2 mmol/L — ABNORMAL LOW (ref 3.5–5.1)
Sodium: 133 mmol/L — ABNORMAL LOW (ref 135–145)

## 2021-01-30 MED ORDER — POTASSIUM CHLORIDE CRYS ER 20 MEQ PO TBCR
40.0000 meq | EXTENDED_RELEASE_TABLET | Freq: Once | ORAL | Status: AC
  Administered 2021-01-30: 40 meq via ORAL
  Filled 2021-01-30: qty 2

## 2021-01-30 MED ORDER — CYANOCOBALAMIN 1000 MCG PO TABS: 1000.0000 ug | ORAL_TABLET | Freq: Every day | ORAL | 3 refills | Status: AC

## 2021-01-30 MED ORDER — ADULT MULTIVITAMIN W/MINERALS CH: 1.0000 | ORAL_TABLET | Freq: Every day | ORAL | 1 refills | Status: AC

## 2021-01-30 MED ORDER — FOLIC ACID 1 MG PO TABS: 1.0000 mg | ORAL_TABLET | Freq: Every day | ORAL | 2 refills | Status: AC

## 2021-01-30 MED ORDER — ONDANSETRON 8 MG PO TBDP: 8.0000 mg | ORAL_TABLET | Freq: Three times a day (TID) | ORAL | 0 refills | Status: AC | PRN

## 2021-01-30 MED ORDER — ACETAMINOPHEN 325 MG PO TABS: 650.0000 mg | ORAL_TABLET | Freq: Four times a day (QID) | ORAL | 0 refills | Status: AC | PRN

## 2021-01-30 MED ORDER — CEFDINIR 300 MG PO CAPS: 300.0000 mg | ORAL_CAPSULE | Freq: Two times a day (BID) | ORAL | 0 refills | Status: AC

## 2021-01-30 MED ORDER — PANTOPRAZOLE SODIUM 40 MG PO TBEC: 40.0000 mg | DELAYED_RELEASE_TABLET | Freq: Every day | ORAL | 1 refills | Status: AC

## 2021-01-30 MED ORDER — AMLODIPINE BESYLATE 5 MG PO TABS: 5.0000 mg | ORAL_TABLET | Freq: Every day | ORAL | 1 refills | Status: DC

## 2021-01-30 NOTE — Discharge Summary (Signed)
Physician Discharge Summary  Jon Osborne GEZ:[id number] DOB: 1961/06/18 DOA: 01/28/2021  PCP: Ivan Anchors, FNP  Admit date: 01/28/2021 Discharge date: 01/30/2021  Time spent: 35 minutes  Recommendations for Outpatient Follow-up:  1. Repeat basic metabolic panel to follow to lisinopril 2. Repeat CBC to follow hemoglobin extremity to 3. Repeat chest x-ray in 6 weeks to assure complete resolution of infiltrates 4. Continue assisting patient with alcohol abuse cessation 5. Reassess BP and further adjust antihypertensive regimen as required.   Discharge Diagnoses:  Active Problems:   Acute respiratory failure with hypoxia and hypercapnia (HCC)   Acute pancreatitis   Aortic atherosclerosis (HCC)   Anemia due to stage 3b chronic kidney disease (HCC)   Microcytic anemia   Hyperammonemia (HCC)   Normocytic anemia   Pneumonia of left lower lobe due to infectious organism   Alcohol abuse   Gastroesophageal reflux disease   Primary hypertension   Discharge Condition: Stable and improved.  Discharged home with instruction to follow-up with PCP in 10 days.  CODE STATUS: Full code  Diet recommendation: Heart healthy diet   Filed Weights   01/28/21 0152 01/29/21 0403 01/30/21 0447  Weight: 90.7 kg 85.5 kg 85.9 kg    History of present illness:  Jon Osborne a 60 y.o.malewith medical history significant ofalcohol abuse, hyperlipidemia, hypertension and a stage IIIb chronic kidney disease; who presented to the hospital with what appears to be a seizure event according to family members.There is reportsof patient waking up suddenly from his sleep and having an episode where he shook all over. Patient expressed no remembering that episode and during presentation to ED he was slightly confused. He expressed feeling slightly short of breath, having abdominal discomfort (midepigastric region),8 out of 10 in intensity, associated with nausea/vomiting, radiating across to the sides  and having some intermittent nonproductive coughing spells.  Patient reports no fever, no chest pain, no diarrhea, no dysuria, no hematuria, no sick contacts. Expressed that he drinks roughly 3 16 ounces beer on daily basis.  COVID screening test negative  ED Course:Patient mentation further improved and no further seizure activity presented. Patient with increased tachypnea and respiratory distress requiring temporarily the use of BiPAP. CT chest, abdomen and pelvis demonstrated findings positive for pneumonia and also inflammatory changes consistent with pancreatitis. Patient with a lipase of 579, WBC 11.7, sodium 123 and a stable creatinine for him of 1.91.Cultures taken, antibiotics started, IV fluids given and TRH contacted to place patient in the hospital for further management and management.  Hospital Course:  1-acute respiratory failure with hypoxia in the setting of pneumonia -Community-acquired versus aspiration suspected. -Currently afebrile -No further requiring oxygen supplementation -No tachypnea appreciated on exam -Complete antibiotic therapy using cefdinir twice a day -Will plan for a 5 days antibiotic therapy.  2-acute alcohol pancreatitis -Patient expressed no nausea or vomiting -Tolerating low-fat diet prior to discharge without any difficulties. -Continue to maintain adequate hydration -Advised to use as needed analgesics and antiemetics.  3-alcohol abuse -Cessation counseling provided -Continue folic acid and multivitamins for thiamine repletion after discharge. -No active withdrawal currently.  4-hypertension -Low-dose amlodipine will be started -Follow vital signs and further adjust antihypertensive regimen as needed. -Alcohol cessation once again encouraged.  5-chronic kidney disease a stage IIIb -At baseline -Maintain adequate hydration and minimize nephrotoxic agents usage.  6-anemia of chronic kidney disease -No signs of overt  bleeding -Hemoglobin at baseline -B12 borderline normal; oral daily supplementation started.  Repeat B12 level in 8 weeks. -Repeat CBC at  follow-up visit to assess hemoglobin stability.  7-gastroesophageal flux disease -Continue PPI -Encourage 12 mm alcohol substances treated  8-hyponatremia -Improved and with electrolytes within normal limits currently -Continue to follow trend with repeat basic metabolic panel follow-up next. -Vancomycin to maintain adequate hydration.  Procedures:  See below for x-ray reports.   Consultations:  None   Discharge Exam: Vitals:   01/30/21 1415 01/30/21 1417  BP:    Pulse: (!) 120 (!) 110  Resp:    Temp:    SpO2: 100%     General: Afebrile, no chest pain, no nausea, no vomiting, no shortness of breath.  Reports good tolerance to diet and expressed no abdominal pain.  Ready to go home. Cardiovascular: Mild sinus tachycardia; no rubs, no gallops, no edema, no JVD. Respiratory: Positive scattered rhonchi, no wheezing, no crackles.  No using accessory muscles. Abdomen: Soft, nontender, positive bowel sounds Extremities: no cyanosis, no clubbing.  Discharge Instructions   Discharge Instructions    Diet - low sodium heart healthy   Complete by: As directed    Discharge instructions   Complete by: As directed    Follow heart healthy and low fat diet Maintain adequate hydration Arrange follow-up with PCP in 10 days Take medications as prescribed Stop alcohol consumption     Allergies as of 01/30/2021   No Known Allergies     Medication List    TAKE these medications   acetaminophen 325 MG tablet Commonly known as: TYLENOL Take 2 tablets (650 mg total) by mouth every 6 (six) hours as needed for mild pain or headache (or Fever >/= 101).   amLODipine 5 MG tablet Commonly known as: NORVASC Take 1 tablet (5 mg total) by mouth daily. Start taking on: Jan 31, 2021   cefdinir 300 MG capsule Commonly known as: OMNICEF Take 1  capsule (300 mg total) by mouth 2 (two) times daily for 5 days.   cyanocobalamin 1000 MCG tablet Take 1 tablet (1,000 mcg total) by mouth daily. Start taking on: Jan 31, 9562   folic acid 1 MG tablet Commonly known as: FOLVITE Take 1 tablet (1 mg total) by mouth daily. Start taking on: Jan 31, 2021   multivitamin with minerals Tabs tablet Take 1 tablet by mouth daily. Start taking on: Jan 31, 2021   ondansetron 8 MG disintegrating tablet Commonly known as: Zofran ODT Take 1 tablet (8 mg total) by mouth every 8 (eight) hours as needed for nausea or vomiting.   pantoprazole 40 MG tablet Commonly known as: PROTONIX Take 1 tablet (40 mg total) by mouth daily. Start taking on: Jan 31, 2021      No Known Allergies  Follow-up Information    Ivan Anchors, FNP. Schedule an appointment as soon as possible for a visit in 10 day(s).   Specialty: Family Medicine Contact information: [address] [place] [address] [phone]               The results of significant diagnostics from this hospitalization (including imaging, microbiology, ancillary and laboratory) are listed below for reference.    Significant Diagnostic Studies: CT HEAD WO CONTRAST  Result Date: 01/28/2021 CLINICAL DATA:  Delirium. Sudden onset tachycardia and hypertension. EXAM: CT HEAD WITHOUT CONTRAST TECHNIQUE: Contiguous axial images were obtained from the base of the skull through the vertex without intravenous contrast. COMPARISON:  None. FINDINGS: Brain: No evidence of large-territorial acute infarction. No parenchymal hemorrhage. No mass lesion. No extra-axial collection. No mass effect or midline shift. No  hydrocephalus. Basilar cisterns are patent. Vascular: No hyperdense vessel. Skull: No acute fracture or focal lesion. Sinuses/Orbits: Paranasal sinuses and mastoid air cells are clear. The orbits are unremarkable. Other: Periapical lucencies surrounding a left maxillary teeth. IMPRESSION: 1.  No acute intracranial abnormality. 2. Periapical lucencies surrounding a left maxillary teeth. Correlate with physical exam for infection. Electronically Signed   By: Iven Finn M.D.   On: 01/28/2021 03:04   CT Angio Chest PE W and/or Wo Contrast  Result Date: 01/28/2021 CLINICAL DATA:  status post fall 2 days ago. Abdominal pain. Chest pain and shortness of breath. Elevated blood pressure. Rule out acute pulmonary embolus. EXAM: CT ANGIOGRAPHY CHEST CT ABDOMEN AND PELVIS WITH CONTRAST TECHNIQUE: Multidetector CT imaging of the chest was performed using the standard protocol during bolus administration of intravenous contrast. Multiplanar CT image reconstructions and MIPs were obtained to evaluate the vascular anatomy. Multidetector CT imaging of the abdomen and pelvis was performed using the standard protocol during bolus administration of intravenous contrast. CONTRAST:  155mL OMNIPAQUE IOHEXOL 350 MG/ML SOLN COMPARISON:  CT chest from 02/18/2018 FINDINGS: CTA CHEST FINDINGS Cardiovascular: Exam detail diminished due to respiratory motion artifact. No convincing evidence for acute pulmonary embolus. There is a focal area of low-attenuation attenuation within a segmental branch to the lateral right lower lobe, image 211/3. This is favored to represent artifact secondary to motion artifact as well as sub optimal pulmonary arterial opacification. Normal heart size. No pericardial effusion. Coronary artery calcifications. Mediastinum/Nodes: Normal appearance of the thyroid gland. The trachea appears patent and is midline. Normal appearance of the esophagus. No supraclavicular, axillary, mediastinal, or hilar adenopathy. Lungs/Pleura: Trace left pleural effusion. Airspace consolidation involving greater than 50% of the left lower lobe with air bronchograms noted. Small nodule within the periphery of the left upper lobe measures 3 mm. New from the previous exam. Musculoskeletal: No chest wall abnormality. No  acute or significant osseous findings. Review of the MIP images confirms the above findings. CT ABDOMEN and PELVIS FINDINGS Hepatobiliary: No focal liver abnormality. No gallbladder unremarkable. No signs of gallbladder wall inflammation or bile duct dilatation. Pancreas: There is diffuse pancreatic edema along with peripancreatic fat stranding and a small amount of free fluid. Imaging findings are compatible with acute pancreatitis. No signs of pancreatic necrosis or mature pseudocyst formation. Spleen: Normal in size without focal abnormality. Adrenals/Urinary Tract: Normal appearance of the adrenal glands. No kidney mass or hydronephrosis. Urinary bladder is unremarkable. Stomach/Bowel: Stomach appears normal. The appendix is difficult to visualize separate from the right lower quadrant bowel loops. No secondary signs of acute appendicitis. No bowel wall thickening, inflammation, or distension. Vascular/Lymphatic: Aortic atherosclerosis. No aneurysm. No abdominopelvic adenopathy. Reproductive: Prostate is unremarkable. Other: No discrete fluid collections identified. No ventral abdominal wall hernia. Musculoskeletal: No acute or significant osseous findings. 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 Review of the MIP images confirms the above findings. IMPRESSION: 1. Exam detail is diminished due to respiratory motion artifact. No convincing evidence for acute pulmonary embolus. 2. Left lower lobe airspace consolidation compatible with pneumonia and/or aspiration. Advise follow-up imaging to ensure resolution. 3. Acute pancreatitis. No signs of pancreatic necrosis or mature pseudocyst formation. 4. Aortic atherosclerosis. Coronary artery calcifications. 5. Small nodule within the periphery of the left upper lobe measures 3 mm. No follow-up needed if patient is low-risk. Non-contrast chest CT can be considered in 12 months if patient is high-risk. This recommendation follows the consensus statement:  Guidelines for Management of Incidental Pulmonary Nodules Detected on  CT Images: From the Fleischner Society 2017; Radiology 2017; [id number]. Aortic Atherosclerosis (ICD10-I70.0). Electronically Signed   By: Kerby Moors M.D.   On: 01/28/2021 05:38   CT ABDOMEN PELVIS W CONTRAST  Result Date: 01/28/2021 CLINICAL DATA:  status post fall 2 days ago. Abdominal pain. Chest pain and shortness of breath. Elevated blood pressure. Rule out acute pulmonary embolus. EXAM: CT ANGIOGRAPHY CHEST CT ABDOMEN AND PELVIS WITH CONTRAST TECHNIQUE: Multidetector CT imaging of the chest was performed using the standard protocol during bolus administration of intravenous contrast. Multiplanar CT image reconstructions and MIPs were obtained to evaluate the vascular anatomy. Multidetector CT imaging of the abdomen and pelvis was performed using the standard protocol during bolus administration of intravenous contrast. CONTRAST:  127mL OMNIPAQUE IOHEXOL 350 MG/ML SOLN COMPARISON:  CT chest from 02/18/2018 FINDINGS: CTA CHEST FINDINGS Cardiovascular: Exam detail diminished due to respiratory motion artifact. No convincing evidence for acute pulmonary embolus. There is a focal area of low-attenuation attenuation within a segmental branch to the lateral right lower lobe, image 211/3. This is favored to represent artifact secondary to motion artifact as well as sub optimal pulmonary arterial opacification. Normal heart size. No pericardial effusion. Coronary artery calcifications. Mediastinum/Nodes: Normal appearance of the thyroid gland. The trachea appears patent and is midline. Normal appearance of the esophagus. No supraclavicular, axillary, mediastinal, or hilar adenopathy. Lungs/Pleura: Trace left pleural effusion. Airspace consolidation involving greater than 50% of the left lower lobe with air bronchograms noted. Small nodule within the periphery of the left upper lobe measures 3 mm. New from the previous exam. Musculoskeletal:  No chest wall abnormality. No acute or significant osseous findings. Review of the MIP images confirms the above findings. CT ABDOMEN and PELVIS FINDINGS Hepatobiliary: No focal liver abnormality. No gallbladder unremarkable. No signs of gallbladder wall inflammation or bile duct dilatation. Pancreas: There is diffuse pancreatic edema along with peripancreatic fat stranding and a small amount of free fluid. Imaging findings are compatible with acute pancreatitis. No signs of pancreatic necrosis or mature pseudocyst formation. Spleen: Normal in size without focal abnormality. Adrenals/Urinary Tract: Normal appearance of the adrenal glands. No kidney mass or hydronephrosis. Urinary bladder is unremarkable. Stomach/Bowel: Stomach appears normal. The appendix is difficult to visualize separate from the right lower quadrant bowel loops. No secondary signs of acute appendicitis. No bowel wall thickening, inflammation, or distension. Vascular/Lymphatic: Aortic atherosclerosis. No aneurysm. No abdominopelvic adenopathy. Reproductive: Prostate is unremarkable. Other: No discrete fluid collections identified. No ventral abdominal wall hernia. Musculoskeletal: No acute or significant osseous findings. 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 Review of the MIP images confirms the above findings. IMPRESSION: 1. Exam detail is diminished due to respiratory motion artifact. No convincing evidence for acute pulmonary embolus. 2. Left lower lobe airspace consolidation compatible with pneumonia and/or aspiration. Advise follow-up imaging to ensure resolution. 3. Acute pancreatitis. No signs of pancreatic necrosis or mature pseudocyst formation. 4. Aortic atherosclerosis. Coronary artery calcifications. 5. Small nodule within the periphery of the left upper lobe measures 3 mm. No follow-up needed if patient is low-risk. Non-contrast chest CT can be considered in 12 months if patient is high-risk. This recommendation follows  the consensus statement: Guidelines for Management of Incidental Pulmonary Nodules Detected on CT Images: From the Fleischner Society 2017; Radiology 2017; 284:228-243. Aortic Atherosclerosis (ICD10-I70.0). Electronically Signed   By: Kerby Moors M.D.   On: 01/28/2021 05:38   DG Chest Port 1 View  Result Date: 01/28/2021 CLINICAL DATA:  Altered mental status.  Headache seizure. EXAM:  PORTABLE CHEST 1 VIEW COMPARISON:  CT chest 02/18/2018 FINDINGS: The heart size and mediastinal contours are within normal limits. Elevated left hemidiaphragm with linear atelectasis. Question retrocardiac opacity which could be related to previously identified nodule on CT chest 02/18/2018. No pulmonary edema. No pleural effusion. No pneumothorax. No acute osseous abnormality. Gaseous distension of the visualized bowel within the left upper abdomen. IMPRESSION: 1. Elevated left hemidiaphragm with linear atelectasis. Question retrocardiac opacity which could be related to previously identified nodule on CT chest 02/18/2018. 2. Gaseous distension of the visualized bowel within the left upper abdomen. Electronically Signed   By: Iven Finn M.D.   On: 01/28/2021 03:02    Microbiology: Recent Results (from the past 240 hour(s))  Resp Panel by RT-PCR (Flu A&B, Covid) Nasopharyngeal Swab     Status: None   Collection Time: 01/28/21  3:00 AM   Specimen: Nasopharyngeal Swab; Nasopharyngeal(NP) swabs in vial transport medium  Result Value Ref Range Status   SARS Coronavirus 2 by RT PCR NEGATIVE NEGATIVE Final    Comment: (NOTE) SARS-CoV-2 target nucleic acids are NOT DETECTED.  The SARS-CoV-2 RNA is generally detectable in upper respiratory specimens during the acute phase of infection. The lowest concentration of SARS-CoV-2 viral copies this assay can detect is 138 copies/mL. A negative result does not preclude SARS-Cov-2 infection and should not be used as the sole basis for treatment or other patient management  decisions. A negative result may occur with  improper specimen collection/handling, submission of specimen other than nasopharyngeal swab, presence of viral mutation(s) within the areas targeted by this assay, and inadequate number of viral copies(<138 copies/mL). A negative result must be combined with clinical observations, patient history, and epidemiological information. The expected result is Negative.  Fact Sheet for Patients:  EntrepreneurPulse.com.au  Fact Sheet for Healthcare Providers:  IncredibleEmployment.be  This test is no t yet approved or cleared by the [place] FDA and  has been authorized for detection and/or diagnosis of SARS-CoV-2 by FDA under an Emergency Use Authorization (EUA). This EUA will remain  in effect (meaning this test can be used) for the duration of the COVID-19 declaration under Section 564(b)(1) of the Act, 21 U.S.C.section 360bbb-3(b)(1), unless the authorization is terminated  or revoked sooner.       Influenza A by PCR NEGATIVE NEGATIVE Final   Influenza B by PCR NEGATIVE NEGATIVE Final    Comment: (NOTE) The Xpert Xpress SARS-CoV-2/FLU/RSV plus assay is intended as an aid in the diagnosis of influenza from Nasopharyngeal swab specimens and should not be used as a sole basis for treatment. Nasal washings and aspirates are unacceptable for Xpert Xpress SARS-CoV-2/FLU/RSV testing.  Fact Sheet for Patients: EntrepreneurPulse.com.au  Fact Sheet for Healthcare Providers: IncredibleEmployment.be  This test is not yet approved or cleared by the [place] FDA and has been authorized for detection and/or diagnosis of SARS-CoV-2 by FDA under an Emergency Use Authorization (EUA). This EUA will remain in effect (meaning this test can be used) for the duration of the COVID-19 declaration under Section 564(b)(1) of the Act, 21 U.S.C. section 360bbb-3(b)(1), unless the  authorization is terminated or revoked.  Performed at [facility], [address] [place], [place] [address]   Blood Cultures (routine x 2)     Status: None (Preliminary result)   Collection Time: 01/28/21  3:16 AM   Specimen: BLOOD  Result Value Ref Range Status   Specimen Description BLOOD BLOOD LEFT ARM  Final   Special Requests   Final  Blood Culture adequate volume BOTTLES DRAWN AEROBIC AND ANAEROBIC   Culture   Final    NO GROWTH 2 DAYS Performed at [facility], [address] [place], [place] [address]    Report Status PENDING  Incomplete  Blood Cultures (routine x 2)     Status: None (Preliminary result)   Collection Time: 01/28/21  3:23 AM   Specimen: BLOOD  Result Value Ref Range Status   Specimen Description BLOOD BLOOD LEFT HAND  Final   Special Requests   Final    Blood Culture results may not be optimal due to an inadequate volume of blood received in culture bottles BOTTLES DRAWN AEROBIC ONLY   Culture   Final    NO GROWTH 2 DAYS Performed at [facility], [address], [place], [place] [address]    Report Status PENDING  Incomplete  MRSA PCR Screening     Status: None   Collection Time: 01/28/21 10:00 AM   Specimen: Nasal Mucosa; Nasopharyngeal  Result Value Ref Range Status   MRSA by PCR NEGATIVE NEGATIVE Final    Comment:        The GeneXpert MRSA Assay (FDA approved for NASAL specimens only), is one component of a comprehensive MRSA colonization surveillance program. It is not intended to diagnose MRSA infection nor to guide or monitor treatment for MRSA infections. Performed at [facility], [address], [place], [place] [address]      Labs: Basic Metabolic Panel: Recent Labs  Lab 01/28/21 0157 01/28/21 0749 01/28/21 1905 01/29/21 0510 01/30/21 0432  NA 133*  --  135 135 133*  K 4.0  --  4.4 4.3 3.2*  CL 96*  --  104 102 103  CO2 24  --  20* 22 23  GLUCOSE 153*  --  77 61* 80  BUN 23*  --  21* 20 18  CREATININE 1.91*  --  1.66*  1.67* 1.62*  CALCIUM 9.1  --  8.4* 8.5* 8.2*  MG 1.7  --  1.9  --   --   PHOS 3.2 3.2 2.5  --   --    Liver Function Tests: Recent Labs  Lab 01/28/21 0157 01/28/21 1905 01/29/21 0510  AST 29 30 33  ALT 18 11 15   ALKPHOS 115 88 84  BILITOT 2.0* 1.1 1.4*  PROT 8.5* 6.8 6.7  ALBUMIN 4.0 3.3* 3.1*   Recent Labs  Lab 01/28/21 0157 01/29/21 0510  LIPASE 579* 375*   Recent Labs  Lab 01/28/21 0157  AMMONIA 58*   CBC: Recent Labs  Lab 01/28/21 0157 01/28/21 1905 01/29/21 0510 01/30/21 0432  WBC 11.7* 12.7* 11.4* 10.2  NEUTROABS 9.7*  --   --   --   HGB 12.9* 11.2* 11.8* 10.5*  HCT 37.9* 33.6* 36.9* 32.1*  MCV 100.0 103.1* 107.0* 103.2*  PLT 340 286 247 266   CBG: Recent Labs  Lab 01/29/21 0814 01/29/21 1128  GLUCAP 56* 91   Signed:  Barton Dubois MD.  [facility] 01/30/2021, 2:45 PM

## 2021-01-30 NOTE — Progress Notes (Signed)
   01/30/21 1404  Assess: MEWS Score  Temp 98.9 F (37.2 C)  BP 110/83  Pulse Rate (!) 122  Resp 18  SpO2 99 %  O2 Device Room Air  Assess: MEWS Score  MEWS Temp 0  MEWS Systolic 0  MEWS Pulse 2  MEWS RR 0  MEWS LOC 0  MEWS Score 2  MEWS Score Color Yellow  Assess: if the MEWS score is Yellow or Red  Were vital signs taken at a resting state? Yes  Focused Assessment No change from prior assessment  Early Detection of Sepsis Score *See Row Information* Low  MEWS guidelines implemented *See Row Information* Yes  Notify: Charge Nurse/RN  Name of Charge Nurse/RN Notified Rachael RN  Date Charge Nurse/RN Notified 01/30/21  Time Charge Nurse/RN Notified 1412  Notify: Provider  Provider Name/Title Barton Dubois MD  Date Provider Notified 01/30/21  Time Provider Notified 1413  Notification Type Page  Notification Reason Other (Comment) (Yellow MEWS)  Provider response No new orders  Date of Provider Response 01/30/21  Time of Provider Response 1414

## 2021-01-30 NOTE — Progress Notes (Signed)
Nsg Discharge Note  Admit Date:  01/28/2021 Discharge date: 01/30/2021   Nelda Severe Delucia to be D/C'd Home  per MD order.  AVS completed.  Copy for chart, and copy for patient signed, and dated. Patient/caregiver able to verbalize understanding.  Discharge Medication: Allergies as of 01/30/2021   No Known Allergies     Medication List    TAKE these medications   acetaminophen 325 MG tablet Commonly known as: TYLENOL Take 2 tablets (650 mg total) by mouth every 6 (six) hours as needed for mild pain or headache (or Fever >/= 101).   amLODipine 5 MG tablet Commonly known as: NORVASC Take 1 tablet (5 mg total) by mouth daily. Start taking on: Jan 31, 2021   cefdinir 300 MG capsule Commonly known as: OMNICEF Take 1 capsule (300 mg total) by mouth 2 (two) times daily for 5 days.   cyanocobalamin 1000 MCG tablet Take 1 tablet (1,000 mcg total) by mouth daily. Start taking on: Jan 31, 1101   folic acid 1 MG tablet Commonly known as: FOLVITE Take 1 tablet (1 mg total) by mouth daily. Start taking on: Jan 31, 2021   multivitamin with minerals Tabs tablet Take 1 tablet by mouth daily. Start taking on: Jan 31, 2021   ondansetron 8 MG disintegrating tablet Commonly known as: Zofran ODT Take 1 tablet (8 mg total) by mouth every 8 (eight) hours as needed for nausea or vomiting.   pantoprazole 40 MG tablet Commonly known as: PROTONIX Take 1 tablet (40 mg total) by mouth daily. Start taking on: Jan 31, 2021       Discharge Assessment: Vitals:   01/30/21 1415 01/30/21 1417  BP:    Pulse: (!) 120 (!) 110  Resp:    Temp:    SpO2: 100%    Skin clean, dry and intact without evidence of skin break down, no evidence of skin tears noted. IV catheter discontinued intact. Site without signs and symptoms of complications - no redness or edema noted at insertion site, patient denies c/o pain - only slight tenderness at site.  Dressing with slight pressure applied.  D/c  Instructions-Education: Discharge instructions given to patient/family with verbalized understanding. D/c education completed with patient/family including follow up instructions, medication list, d/c activities limitations if indicated, with other d/c instructions as indicated by MD - patient able to verbalize understanding, all questions fully answered. Patient instructed to return to ED, call 911, or call MD for any changes in condition.  Patient escorted via [place], and D/C home via private auto.  Clovis Fredrickson, LPN 09/27/7354 7:01 PM

## 2021-01-31 LAB — LEGIONELLA PNEUMOPHILA SEROGP 1 UR AG: L. pneumophila Serogp 1 Ur Ag: NEGATIVE

## 2021-02-02 LAB — CULTURE, BLOOD (ROUTINE X 2)
Culture: NO GROWTH
Culture: NO GROWTH
Special Requests: ADEQUATE

## 2021-08-11 ENCOUNTER — Encounter: Payer: Self-pay | Admitting: Emergency Medicine

## 2021-08-11 ENCOUNTER — Emergency Department: Payer: Medicaid Other

## 2021-08-11 ENCOUNTER — Other Ambulatory Visit: Payer: Self-pay

## 2021-08-11 ENCOUNTER — Inpatient Hospital Stay
Admission: EM | Admit: 2021-08-11 | Discharge: 2021-08-14 | DRG: 439 | Disposition: A | Discharge status: Home / Self Care | Payer: Medicaid Other | Attending: Internal Medicine | Admitting: Internal Medicine

## 2021-08-11 DIAGNOSIS — Z20822 Contact with and (suspected) exposure to covid-19: Secondary | ICD-10-CM | POA: Diagnosis present

## 2021-08-11 DIAGNOSIS — E876 Hypokalemia: Secondary | ICD-10-CM | POA: Diagnosis present

## 2021-08-11 DIAGNOSIS — F1020 Alcohol dependence, uncomplicated: Secondary | ICD-10-CM

## 2021-08-11 DIAGNOSIS — I10 Essential (primary) hypertension: Secondary | ICD-10-CM

## 2021-08-11 DIAGNOSIS — F17210 Nicotine dependence, cigarettes, uncomplicated: Secondary | ICD-10-CM | POA: Diagnosis present

## 2021-08-11 DIAGNOSIS — C3490 Malignant neoplasm of unspecified part of unspecified bronchus or lung: Secondary | ICD-10-CM | POA: Diagnosis present

## 2021-08-11 DIAGNOSIS — R109 Unspecified abdominal pain: Secondary | ICD-10-CM

## 2021-08-11 DIAGNOSIS — F1990 Other psychoactive substance use, unspecified, uncomplicated: Secondary | ICD-10-CM | POA: Diagnosis not present

## 2021-08-11 DIAGNOSIS — I129 Hypertensive chronic kidney disease with stage 1 through stage 4 chronic kidney disease, or unspecified chronic kidney disease: Secondary | ICD-10-CM | POA: Diagnosis present

## 2021-08-11 DIAGNOSIS — Z9221 Personal history of antineoplastic chemotherapy: Secondary | ICD-10-CM

## 2021-08-11 DIAGNOSIS — Z79899 Other long term (current) drug therapy: Secondary | ICD-10-CM | POA: Diagnosis not present

## 2021-08-11 DIAGNOSIS — K859 Acute pancreatitis without necrosis or infection, unspecified: Secondary | ICD-10-CM | POA: Diagnosis present

## 2021-08-11 DIAGNOSIS — N189 Chronic kidney disease, unspecified: Secondary | ICD-10-CM

## 2021-08-11 DIAGNOSIS — N179 Acute kidney failure, unspecified: Secondary | ICD-10-CM | POA: Diagnosis present

## 2021-08-11 DIAGNOSIS — K852 Alcohol induced acute pancreatitis without necrosis or infection: Secondary | ICD-10-CM | POA: Diagnosis not present

## 2021-08-11 DIAGNOSIS — R918 Other nonspecific abnormal finding of lung field: Secondary | ICD-10-CM

## 2021-08-11 DIAGNOSIS — Z923 Personal history of irradiation: Secondary | ICD-10-CM | POA: Diagnosis not present

## 2021-08-11 DIAGNOSIS — F1410 Cocaine abuse, uncomplicated: Secondary | ICD-10-CM | POA: Diagnosis present

## 2021-08-11 DIAGNOSIS — E7800 Pure hypercholesterolemia, unspecified: Secondary | ICD-10-CM | POA: Diagnosis present

## 2021-08-11 DIAGNOSIS — N1832 Chronic kidney disease, stage 3b: Secondary | ICD-10-CM | POA: Diagnosis present

## 2021-08-11 LAB — COMPREHENSIVE METABOLIC PANEL
ALT: 22 U/L (ref 0–44)
AST: 40 U/L (ref 15–41)
Albumin: 4.1 g/dL (ref 3.5–5.0)
Alkaline Phosphatase: 83 U/L (ref 38–126)
Anion gap: 12 (ref 5–15)
BUN: 26 mg/dL — ABNORMAL HIGH (ref 6–20)
CO2: 23 mmol/L (ref 22–32)
Calcium: 9.3 mg/dL (ref 8.9–10.3)
Chloride: 96 mmol/L — ABNORMAL LOW (ref 98–111)
Creatinine, Ser: 2.25 mg/dL — ABNORMAL HIGH (ref 0.61–1.24)
GFR, Estimated: 33 mL/min — ABNORMAL LOW (ref >60)
Glucose, Bld: 128 mg/dL — ABNORMAL HIGH (ref 70–99)
Potassium: 3.3 mmol/L — ABNORMAL LOW (ref 3.5–5.1)
Sodium: 131 mmol/L — ABNORMAL LOW (ref 135–145)
Total Bilirubin: 2.1 mg/dL — ABNORMAL HIGH (ref 0.3–1.2)
Total Protein: 8.8 g/dL — ABNORMAL HIGH (ref 6.5–8.1)

## 2021-08-11 LAB — LIPASE, BLOOD: Lipase: 494 U/L — ABNORMAL HIGH (ref 11–51)

## 2021-08-11 LAB — CBC WITH DIFFERENTIAL/PLATELET
Abs Immature Granulocytes: 0.04 10*3/uL (ref 0.00–0.07)
Basophils Absolute: 0 10*3/uL (ref 0.0–0.1)
Basophils Relative: 0 %
Eosinophils Absolute: 0 10*3/uL (ref 0.0–0.5)
Eosinophils Relative: 0 %
HCT: 42.7 % (ref 39.0–52.0)
Hemoglobin: 15.1 g/dL (ref 13.0–17.0)
Immature Granulocytes: 0 %
Lymphocytes Relative: 10 %
Lymphs Abs: 1.2 10*3/uL (ref 0.7–4.0)
MCH: 34.2 pg — ABNORMAL HIGH (ref 26.0–34.0)
MCHC: 35.4 g/dL (ref 30.0–36.0)
MCV: 96.8 fL (ref 80.0–100.0)
Monocytes Absolute: 1 10*3/uL (ref 0.1–1.0)
Monocytes Relative: 8 %
Neutro Abs: 9.8 10*3/uL — ABNORMAL HIGH (ref 1.7–7.7)
Neutrophils Relative %: 82 %
Platelets: 384 10*3/uL (ref 150–400)
RBC: 4.41 MIL/uL (ref 4.22–5.81)
RDW: 13.5 % (ref 11.5–15.5)
WBC: 12.1 10*3/uL — ABNORMAL HIGH (ref 4.0–10.5)
nRBC: 0.7 % — ABNORMAL HIGH (ref 0.0–0.2)

## 2021-08-11 LAB — URINALYSIS, ROUTINE W REFLEX MICROSCOPIC
Glucose, UA: NEGATIVE mg/dL
Leukocytes,Ua: NEGATIVE
Nitrite: NEGATIVE
Protein, ur: >300 mg/dL — AB
Specific Gravity, Urine: 1.025 (ref 1.005–1.030)
pH: 5.5 (ref 5.0–8.0)

## 2021-08-11 MED ORDER — SODIUM CHLORIDE 0.9 % IV BOLUS
1000.0000 mL | Freq: Once | INTRAVENOUS | Status: AC
  Administered 2021-08-11: 1000 mL via INTRAVENOUS

## 2021-08-11 MED ORDER — MORPHINE SULFATE (PF) 4 MG/ML IV SOLN
4.0000 mg | Freq: Once | INTRAVENOUS | Status: AC
  Administered 2021-08-11: 4 mg via INTRAVENOUS
  Filled 2021-08-11: qty 1

## 2021-08-11 MED ORDER — AMLODIPINE BESYLATE 5 MG PO TABS
5.0000 mg | ORAL_TABLET | Freq: Every day | ORAL | Status: DC
  Administered 2021-08-11 – 2021-08-14 (×4): 5 mg via ORAL
  Filled 2021-08-11 (×4): qty 1

## 2021-08-11 MED ORDER — SODIUM CHLORIDE 0.9 % IV SOLN: INTRAVENOUS | Status: DC

## 2021-08-11 MED ORDER — ONDANSETRON HCL 4 MG/2ML IJ SOLN
4.0000 mg | Freq: Four times a day (QID) | INTRAMUSCULAR | Status: DC | PRN
  Administered 2021-08-13: 05:00:00 4 mg via INTRAVENOUS
  Filled 2021-08-11: qty 2

## 2021-08-11 MED ORDER — ACETAMINOPHEN 650 MG RE SUPP
650.0000 mg | Freq: Four times a day (QID) | RECTAL | Status: DC | PRN
  Filled 2021-08-11: qty 1

## 2021-08-11 MED ORDER — ONDANSETRON HCL 4 MG/2ML IJ SOLN
4.0000 mg | Freq: Once | INTRAMUSCULAR | Status: AC
  Administered 2021-08-11: 4 mg via INTRAVENOUS
  Filled 2021-08-11: qty 2

## 2021-08-11 MED ORDER — ACETAMINOPHEN 325 MG PO TABS
650.0000 mg | ORAL_TABLET | Freq: Four times a day (QID) | ORAL | Status: DC | PRN
  Administered 2021-08-12 (×2): 650 mg via ORAL
  Filled 2021-08-11 (×2): qty 2

## 2021-08-11 MED ORDER — KETOROLAC TROMETHAMINE 30 MG/ML IJ SOLN
15.0000 mg | Freq: Once | INTRAMUSCULAR | Status: AC
  Administered 2021-08-11: 15 mg via INTRAVENOUS
  Filled 2021-08-11: qty 1

## 2021-08-11 MED ORDER — ONDANSETRON HCL 4 MG PO TABS: 4.0000 mg | ORAL_TABLET | Freq: Four times a day (QID) | ORAL | Status: DC | PRN

## 2021-08-11 MED ORDER — ENOXAPARIN SODIUM 40 MG/0.4ML IJ SOSY
40.0000 mg | PREFILLED_SYRINGE | INTRAMUSCULAR | Status: DC
  Administered 2021-08-11 – 2021-08-13 (×3): 40 mg via SUBCUTANEOUS
  Filled 2021-08-11 (×3): qty 0.4

## 2021-08-11 NOTE — ED Provider Notes (Signed)
HPI: Pt is a 60 y.o. male who presents with complaints of abd pain.  The patient p/w  no BM for four days, typically has one every day. No OTC medications. + NV .   Stage 4 lung cancer.  Still drinking alcohol.   ROS: Denies fever, chest pain, vomiting  Past Medical History:  Diagnosis Date   Hypercholesteremia    Hypertension    Stage 3b chronic kidney disease ([place]) 01/28/2021   There were no vitals filed for this visit.  Focused Physical Exam: Gen: No acute distress Head: atraumatic, normocephalic Eyes: Extraocular movements grossly intact; conjunctiva clear CV: RRR Lung: No increased WOB, no stridor GI: ND, no obvious masses, reports pain but non tender with pushing,  Neuro: Alert and awake  Medical Decision Making and Plan: Given the patient's initial medical screening exam, the following diagnostic evaluation has been ordered. The patient will be placed in the appropriate treatment space, once one is available, to complete the evaluation and treatment. I have discussed the plan of care with the patient and I have advised the patient that an ED physician or mid-level practitioner will reevaluate their condition after the test results have been received, as the results may give them additional insight into the type of treatment they may need.   Diagnostics: labs, xray   Treatments: none immediately   Vanessa [place], MD 08/11/21 [phone]

## 2021-08-11 NOTE — ED Provider Notes (Signed)
[facility]  ____________________________________________   Event Date/Time   First MD Initiated Contact with Patient 08/11/21 1647     (approximate)  I have reviewed the triage vital signs and the nursing notes.   HISTORY  Chief Complaint Abdominal Pain    HPI Jon Osborne is a 60 y.o. male with past medical history of hyperlipidemia, CKD, hypertension, remote history of lung cancer now in remission, history of pancreatitis who presents with abdominal pain.  Symptoms started 4 days ago.  Pain is diffuse intermittent feels sharp in nature.  He has associated abdominal distention and bloating.  Has not had a bowel movement in several days which is unusual for him, usually goes every day.  He has had several episodes of emesis.  Denies fevers chills.  No urinary symptoms.  No prior history of abdominal surgeries.         Past Medical History:  Diagnosis Date   Hypercholesteremia    Hypertension    Stage 3b chronic kidney disease ([place]) 01/28/2021    Patient Active Problem List   Diagnosis Date Noted   Pneumonia of left lower lobe due to infectious organism    Alcohol abuse    Gastroesophageal reflux disease    Primary hypertension    Acute respiratory failure with hypoxia and hypercapnia ([place]) 01/28/2021   Acute pancreatitis 01/28/2021   Aortic atherosclerosis ([place]) 01/28/2021   Anemia due to stage 3b chronic kidney disease ([place]) 01/28/2021   Microcytic anemia 01/28/2021   Hyperammonemia ([place]) 01/28/2021   Normocytic anemia 01/28/2021    History reviewed. No pertinent surgical history.  Prior to Admission medications   Medication Sig Start Date End Date Taking? Authorizing Provider  acetaminophen (TYLENOL) 325 MG tablet Take 2 tablets (650 mg total) by mouth every 6 (six) hours as needed for mild pain or headache (or Fever >/= 101). 01/30/21   Barton Dubois, MD  amLODipine (NORVASC) 5 MG tablet Take 1 tablet (5 mg total) by mouth daily.  01/31/21   Barton Dubois, MD  folic acid (FOLVITE) 1 MG tablet Take 1 tablet (1 mg total) by mouth daily. 01/31/21   Barton Dubois, MD  Multiple Vitamin (MULTIVITAMIN WITH MINERALS) TABS tablet Take 1 tablet by mouth daily. 01/31/21   Barton Dubois, MD  ondansetron (ZOFRAN ODT) 8 MG disintegrating tablet Take 1 tablet (8 mg total) by mouth every 8 (eight) hours as needed for nausea or vomiting. 01/30/21   Barton Dubois, MD  pantoprazole (PROTONIX) 40 MG tablet Take 1 tablet (40 mg total) by mouth daily. 01/31/21   Barton Dubois, MD  vitamin B-12 1000 MCG tablet Take 1 tablet (1,000 mcg total) by mouth daily. 01/31/21   Barton Dubois, MD    Allergies Patient has no known allergies.  No family history on file.  Social History Social History   Tobacco Use   Smoking status: Every Day   Smokeless tobacco: Never  Substance Use Topics   Alcohol use: Yes   Drug use: No    Review of Systems   Review of Systems  Constitutional:  Negative for chills and fever.  Respiratory:  Negative for shortness of breath.   Gastrointestinal:  Positive for abdominal distention, abdominal pain, constipation, nausea and vomiting. Negative for diarrhea.  Genitourinary:  Negative for dysuria.  All other systems reviewed and are negative.  Physical Exam Updated Vital Signs BP (!) 152/129 (BP Location: Left Arm)   Pulse 61   Temp 98 F (36.7 C) (Oral)   Resp  20   Ht 6\' 2"  (1.88 m)   Wt 86 kg   SpO2 99%   BMI 24.34 kg/m   Physical Exam Vitals and nursing note reviewed.  Constitutional:      General: He is not in acute distress.    Appearance: Normal appearance.  HENT:     Head: Normocephalic and atraumatic.  Eyes:     General: No scleral icterus.    Conjunctiva/sclera: Conjunctivae normal.  Pulmonary:     Effort: Pulmonary effort is normal. No respiratory distress.     Breath sounds: Normal breath sounds. No wheezing.  Abdominal:     General: Abdomen is protuberant.     Tenderness: There  is abdominal tenderness.     Comments: Abdomen is mildly distended, minimally tender to palpation throughout, no guarding  Musculoskeletal:        General: No deformity or signs of injury.     Cervical back: Normal range of motion.  Skin:    Coloration: Skin is not jaundiced or pale.  Neurological:     General: No focal deficit present.     Mental Status: He is alert and oriented to person, place, and time. Mental status is at baseline.  Psychiatric:        Mood and Affect: Mood normal.        Behavior: Behavior normal.     LABS (all labs ordered are listed, but only abnormal results are displayed)  Labs Reviewed  CBC WITH DIFFERENTIAL/PLATELET - Abnormal; Notable for the following components:      Result Value   WBC 12.1 (*)    MCH 34.2 (*)    nRBC 0.7 (*)    Neutro Abs 9.8 (*)    All other components within normal limits  COMPREHENSIVE METABOLIC PANEL - Abnormal; Notable for the following components:   Sodium 131 (*)    Potassium 3.3 (*)    Chloride 96 (*)    Glucose, Bld 128 (*)    BUN 26 (*)    Creatinine, Ser 2.25 (*)    Total Protein 8.8 (*)    Total Bilirubin 2.1 (*)    GFR, Estimated 33 (*)    All other components within normal limits  LIPASE, BLOOD - Abnormal; Notable for the following components:   Lipase 494 (*)    All other components within normal limits  URINALYSIS, ROUTINE W REFLEX MICROSCOPIC - Abnormal; Notable for the following components:   Color, Urine AMBER (*)    APPearance CLEAR (*)    Hgb urine dipstick MODERATE (*)    Bilirubin Urine MODERATE (*)    Ketones, ur TRACE (*)    Protein, ur >300 (*)    Bacteria, UA RARE (*)    All other components within normal limits   ____________________________________________  EKG  N/a ____________________________________________  RADIOLOGY Almeta Monas, personally viewed and evaluated these images (plain radiographs) as part of my medical decision making, as well as reviewing the written  report by the radiologist.  ED MD interpretation: I reviewed the CT abdomen pelvis which is consistent with acute pancreatitis    ____________________________________________   PROCEDURES  Procedure(s) performed (including Critical Care):  Procedures   ____________________________________________   INITIAL IMPRESSION / ASSESSMENT AND PLAN / ED COURSE     69-year-old male presents with 4 days of abdominal pain constipation and vomiting.  Vital signs notable for hypertension but otherwise within normal limits.  Patient is well-appearing.  Abdomen is somewhat distended and he has mild diffuse  tenderness but no peritoneal signs.  Labs are notable for leukocytosis of 12.  CMP and lipase still pending.  We will get a CT abdomen pelvis with contrast to rule out obstruction or other intra-abdominal pathology if patient's creatinine allows.    Patient's lipase is significantly elevated.  T bili 2.1 but otherwise AST and ALT are within normal limits.  CT abdomen pelvis without contrast confirms acute pancreatitis without other acute pathology.  Patient still in significant discomfort.  Will treat with IV opioids and admit for pain control..  Clinical Course as of 08/11/21 1944  Fri Aug 11, 2021  1902 Lipase(!): 556 [KM]    Clinical Course User Index [KM] Rada Hay, MD     ____________________________________________   FINAL CLINICAL IMPRESSION(S) / ED DIAGNOSES  Final diagnoses:  Abdominal pain  Alcohol-induced acute pancreatitis, unspecified complication status     ED Discharge Orders     None        Note:  This document was prepared using Dragon voice recognition software and may include unintentional dictation errors.    Rada Hay, MD 08/11/21 1944

## 2021-08-11 NOTE — ED Triage Notes (Signed)
Pt reports abd pain to his mid abd for 4 days. Pt also reports no BM for 4 days which is not normal for him. Pt family reports pt with hx of pancreatitis as well. Pt states the pain he feels is a pain of needing to have a BM

## 2021-08-11 NOTE — ED Triage Notes (Signed)
Lab called for assistance.

## 2021-08-11 NOTE — H&P (Signed)
History and Physical    Jon Osborne IPJ:[id number] DOB: 1961-01-25 DOA: 08/11/2021  PCP: Ivan Anchors, FNP   Patient coming from: home  I have personally briefly reviewed patient's relevant medical records in [place]  Chief Complaint: abdominal pain  HPI: Jon Osborne is a 60 y.o. male with medical history significant for  alcohol use disorder, small cell lung cancer s/p chemoradiation followed by oncology at [facility], CKD stage IIIb and hypertension who presents to the ED with a 4-day history of abdominal pain typical of a prior episode of acute pancreatitis.  States the pain is severe and generalized, nonradiating associated with  Nausea and  Constipation.  Denies fever or chills. denies cough, shortness of breath or chest pain and denies dysuria.  ED course: On arrival BP 152/129 with otherwise normal vitals Blood work significant for WBC of 12,000, creatinine of 2.25 above baseline of 1.67, and lipase 494.  Total bilirubin 2.1 with otherwise normal LFTs.  Urinalysis unremarkable.  CT abdomen and pelvis confirms acute pancreatitis without acute pathology but also showed "Persistent area of consolidation in the left lower lobe ...suggestive of necrosis. Pulmonary consultation is recommended as neoplasm cannot be excluded given findings on prior CT chest imaging"  Patient treated with IV antiemetics, IV pain But continued to have ongoing pain.  Hospitalist consulted for admission.    Review of Systems: As per HPI otherwise all other systems on review of systems negative.    Past Medical History:  Diagnosis Date   Hypercholesteremia    Hypertension    Stage 3b chronic kidney disease ([place]) 01/28/2021    History reviewed. No pertinent surgical history.   reports that he has been smoking. He has never used smokeless tobacco. He reports current alcohol use. He reports that he does not use drugs.  No Known Allergies  No family history on file.    Prior to Admission  medications   Medication Sig Start Date End Date Taking? Authorizing Provider  acetaminophen (TYLENOL) 325 MG tablet Take 2 tablets (650 mg total) by mouth every 6 (six) hours as needed for mild pain or headache (or Fever >/= 101). 01/30/21   Barton Dubois, MD  amLODipine (NORVASC) 5 MG tablet Take 1 tablet (5 mg total) by mouth daily. 01/31/21   Barton Dubois, MD  folic acid (FOLVITE) 1 MG tablet Take 1 tablet (1 mg total) by mouth daily. 01/31/21   Barton Dubois, MD  Multiple Vitamin (MULTIVITAMIN WITH MINERALS) TABS tablet Take 1 tablet by mouth daily. 01/31/21   Barton Dubois, MD  ondansetron (ZOFRAN ODT) 8 MG disintegrating tablet Take 1 tablet (8 mg total) by mouth every 8 (eight) hours as needed for nausea or vomiting. 01/30/21   Barton Dubois, MD  pantoprazole (PROTONIX) 40 MG tablet Take 1 tablet (40 mg total) by mouth daily. 01/31/21   Barton Dubois, MD  vitamin B-12 1000 MCG tablet Take 1 tablet (1,000 mcg total) by mouth daily. 01/31/21   Barton Dubois, MD    Physical Exam: Vitals:   08/11/21 1617 08/11/21 1618  BP:  (!) 152/129  Pulse:  61  Resp:  20  Temp:  98 F (36.7 C)  TempSrc:  Oral  SpO2:  99%  Weight: 86 kg   Height: 6\' 2"  (1.88 m)    Constitutional: Alert and oriented x 3 . Not in any apparent distress HEENT:      Head: Normocephalic and atraumatic.         Eyes: PERLA, EOMI, Conjunctivae  are normal. Sclera is non-icteric.       Mouth/Throat: Mucous membranes are moist.       Neck: Supple with no signs of meningismus. Cardiovascular: Regular rate and rhythm. No murmurs, gallops, or rubs. 2+ symmetrical distal pulses are present . No JVD. No  LE edema Respiratory: Respiratory effort normal .Lungs sounds clear bilaterally. No wheezes, crackles, or rhonchi.  Gastrointestinal: Soft, tender in mid abdomen, non distended. Positive bowel sounds.  Genitourinary: No CVA tenderness. Musculoskeletal: Nontender with normal range of motion in all extremities. No cyanosis,  or erythema of extremities. Neurologic:  Face is symmetric. Moving all extremities. No gross focal neurologic deficits . Skin: Skin is warm, dry.  No rash or ulcers Psychiatric: Mood and affect are appropriate    Labs on Admission: I have personally reviewed following labs and imaging studies  CBC: Recent Labs  Lab 08/11/21 1631  WBC 12.1*  NEUTROABS 9.8*  HGB 15.1  HCT 42.7  MCV 96.8  PLT 371   Basic Metabolic Panel: Recent Labs  Lab 08/11/21 1631  NA 131*  K 3.3*  CL 96*  CO2 23  GLUCOSE 128*  BUN 26*  CREATININE 2.25*  CALCIUM 9.3   GFR: Estimated Creatinine Clearance: 40.6 mL/min (A) (by C-G formula based on SCr of 2.25 mg/dL (H)). Liver Function Tests: Recent Labs  Lab 08/11/21 1631  AST 40  ALT 22  ALKPHOS 83  BILITOT 2.1*  PROT 8.8*  ALBUMIN 4.1   Recent Labs  Lab 08/11/21 1631  LIPASE 494*   No results for input(s): AMMONIA in the last 168 hours. Coagulation Profile: No results for input(s): INR, PROTIME in the last 168 hours. Cardiac Enzymes: No results for input(s): CKTOTAL, CKMB, CKMBINDEX, TROPONINI in the last 168 hours. BNP (last 3 results) No results for input(s): PROBNP in the last 8760 hours. HbA1C: No results for input(s): HGBA1C in the last 72 hours. CBG: No results for input(s): GLUCAP in the last 168 hours. Lipid Profile: No results for input(s): CHOL, HDL, LDLCALC, TRIG, CHOLHDL, LDLDIRECT in the last 72 hours. Thyroid Function Tests: No results for input(s): TSH, T4TOTAL, FREET4, T3FREE, THYROIDAB in the last 72 hours. Anemia Panel: No results for input(s): VITAMINB12, FOLATE, FERRITIN, TIBC, IRON, RETICCTPCT in the last 72 hours. Urine analysis:    Component Value Date/Time   COLORURINE AMBER (A) 08/11/2021 1621   APPEARANCEUR CLEAR (A) 08/11/2021 1621   LABSPEC 1.025 08/11/2021 1621   PHURINE 5.5 08/11/2021 1621   GLUCOSEU NEGATIVE 08/11/2021 1621   HGBUR MODERATE (A) 08/11/2021 1621   BILIRUBINUR MODERATE (A)  08/11/2021 1621   KETONESUR TRACE (A) 08/11/2021 1621   PROTEINUR >300 (A) 08/11/2021 1621   NITRITE NEGATIVE 08/11/2021 1621   LEUKOCYTESUR NEGATIVE 08/11/2021 1621    Radiological Exams on Admission: CT ABDOMEN PELVIS WO CONTRAST  Result Date: 08/11/2021 CLINICAL DATA:  Distension all pain no bowel movement EXAM: CT ABDOMEN AND PELVIS WITHOUT CONTRAST TECHNIQUE: Multidetector CT imaging of the abdomen and pelvis was performed following the standard protocol without IV contrast. COMPARISON:  Radiograph 08/11/2021, CT 01/28/2021, chest CT 02/18/2018 FINDINGS: Lower chest: Lung bases demonstrate small left-sided pleural effusion. Persistent area of airspace consolidation within the left lower lobe with areas of central low attenuation suggestive of necrosis. There is mild left lower lobe bronchiectasis. Hepatobiliary: No focal liver abnormality is seen. No gallstones, gallbladder wall thickening, or biliary dilatation. Pancreas: Moderate peripancreatic stranding and fluid consistent with acute pancreatitis. Spleen: Atrophic in appearance. Adrenals/Urinary Tract: Adrenal glands are normal.  Kidneys show no hydronephrosis. The bladder is slightly thick walled Stomach/Bowel: The stomach is nonenlarged. No dilated small bowel. No acute bowel wall thickening. Negative appendix. Vascular/Lymphatic: Mild aortic atherosclerosis. No aneurysm. No suspicious nodes. Reproductive: Prostate is unremarkable. Other: Negative for pelvic effusion or free air. Musculoskeletal: Degenerative changes. No acute osseous abnormality. IMPRESSION: 1. Findings consistent with acute pancreatitis. No gross organized fluid collections allowing for absence of contrast 2. Persistent area of consolidation in the left lower lobe with areas of central low density suggestive of necrosis. Pulmonary consultation is recommended as neoplasm cannot be excluded given findings on prior CT chest imaging. Electronically Signed   By: Donavan Foil M.D.    On: 08/11/2021 19:08   DG Abd 2 Views  Result Date: 08/11/2021 CLINICAL DATA:  Abdomen pain EXAM: ABDOMEN - 2 VIEW COMPARISON:  CT 01/28/2021 FINDINGS: No free air beneath the diaphragm. Mild to moderate diffuse gaseous dilatation of small and large bowel with scattered rectal gas. Phleboliths in the pelvis. IMPRESSION: Mild to moderate diffuse gaseous dilatation of the bowel suggestive of ileus or enteritis. Electronically Signed   By: Donavan Foil M.D.   On: 08/11/2021 17:36    Assessment/Plan    Acute alcoholic pancreatitis -Clear liquid diet - IV antiemetics, IV pain medicine, IV fluids - Follow lipase    Alcohol use disorder, moderate, dependence (HCC) - CIWA withdrawal protocol - No history of DTs    HTN (hypertension) - Resume home amlodipine  Abnormal CT scan of lung History of small cell lung cancer s/p chemoradiotherapy - CT suggesting left lower lobe.  Necrosis with recommendation for pulmonary consultation - Consider pulmonary/oncology consult in the a.m.    Acute kidney injury superimposed on CKD lllb (HCC) - Creatinine 2.25 up from baseline of 1.67 - IV hydration and monitor - Avoid nephrotoxins    DVT prophylaxis: Lovenox  Code Status: full code  Family Communication:  none  Disposition Plan: Back to previous home environment Consults called: none  Status:At the time of admission, it appears that the appropriate admission status for this patient is INPATIENT. This is judged to be reasonable and necessary in order to provide the required intensity of service to ensure the patient's safety given the presenting symptoms, physical exam findings, and initial radiographic and laboratory data in the context of their  Comorbid conditions.   Patient requires inpatient status due to high intensity of service, high risk for further deterioration and high frequency of surveillance required.   I certify that at the point of admission it is my clinical judgment that the  patient will require inpatient hospital care spanning beyond [place] MD [facility]   08/11/2021, 8:38 PM

## 2021-08-12 DIAGNOSIS — N189 Chronic kidney disease, unspecified: Secondary | ICD-10-CM

## 2021-08-12 DIAGNOSIS — F1020 Alcohol dependence, uncomplicated: Secondary | ICD-10-CM

## 2021-08-12 DIAGNOSIS — N179 Acute kidney failure, unspecified: Secondary | ICD-10-CM

## 2021-08-12 DIAGNOSIS — K852 Alcohol induced acute pancreatitis without necrosis or infection: Secondary | ICD-10-CM | POA: Diagnosis not present

## 2021-08-12 LAB — URINE DRUG SCREEN, QUALITATIVE (ARMC ONLY)
Amphetamines, Ur Screen: NOT DETECTED
Barbiturates, Ur Screen: NOT DETECTED
Benzodiazepine, Ur Scrn: NOT DETECTED
Cannabinoid 50 Ng, Ur ~~LOC~~: NOT DETECTED
Cocaine Metabolite,Ur ~~LOC~~: POSITIVE — AB
MDMA (Ecstasy)Ur Screen: NOT DETECTED
Methadone Scn, Ur: NOT DETECTED
Opiate, Ur Screen: POSITIVE — AB
Phencyclidine (PCP) Ur S: NOT DETECTED
Tricyclic, Ur Screen: NOT DETECTED

## 2021-08-12 LAB — BASIC METABOLIC PANEL
Anion gap: 7 (ref 5–15)
BUN: 22 mg/dL — ABNORMAL HIGH (ref 6–20)
CO2: 24 mmol/L (ref 22–32)
Calcium: 8.5 mg/dL — ABNORMAL LOW (ref 8.9–10.3)
Chloride: 103 mmol/L (ref 98–111)
Creatinine, Ser: 1.65 mg/dL — ABNORMAL HIGH (ref 0.61–1.24)
GFR, Estimated: 47 mL/min — ABNORMAL LOW (ref >60)
Glucose, Bld: 74 mg/dL (ref 70–99)
Potassium: 3.8 mmol/L (ref 3.5–5.1)
Sodium: 134 mmol/L — ABNORMAL LOW (ref 135–145)

## 2021-08-12 LAB — RESP PANEL BY RT-PCR (FLU A&B, COVID) ARPGX2
Influenza A by PCR: NEGATIVE
Influenza B by PCR: NEGATIVE
SARS Coronavirus 2 by RT PCR: NEGATIVE

## 2021-08-12 LAB — LIPASE, BLOOD: Lipase: 342 U/L — ABNORMAL HIGH (ref 11–51)

## 2021-08-12 LAB — ETHANOL: Alcohol, Ethyl (B): <10 mg/dL (ref <10)

## 2021-08-12 MED ORDER — THIAMINE HCL 100 MG PO TABS
100.0000 mg | ORAL_TABLET | Freq: Every day | ORAL | Status: DC
  Administered 2021-08-12 – 2021-08-14 (×3): 100 mg via ORAL
  Filled 2021-08-12 (×3): qty 1

## 2021-08-12 MED ORDER — FOLIC ACID 1 MG PO TABS
1.0000 mg | ORAL_TABLET | Freq: Every day | ORAL | Status: DC
  Administered 2021-08-12 – 2021-08-14 (×3): 1 mg via ORAL
  Filled 2021-08-12 (×3): qty 1

## 2021-08-12 MED ORDER — THIAMINE HCL 100 MG/ML IJ SOLN: 100.0000 mg | Freq: Every day | INTRAMUSCULAR | Status: DC

## 2021-08-12 MED ORDER — OXYCODONE-ACETAMINOPHEN 5-325 MG PO TABS
1.0000 | ORAL_TABLET | Freq: Four times a day (QID) | ORAL | Status: DC | PRN
  Administered 2021-08-12 – 2021-08-14 (×2): 1 via ORAL
  Filled 2021-08-12 (×2): qty 1

## 2021-08-12 MED ORDER — HYDRALAZINE HCL 50 MG PO TABS
50.0000 mg | ORAL_TABLET | Freq: Four times a day (QID) | ORAL | Status: DC | PRN
  Administered 2021-08-12 – 2021-08-13 (×2): 50 mg via ORAL
  Filled 2021-08-12 (×2): qty 1

## 2021-08-12 MED ORDER — ADULT MULTIVITAMIN W/MINERALS CH
1.0000 | ORAL_TABLET | Freq: Every day | ORAL | Status: DC
  Administered 2021-08-12 – 2021-08-14 (×3): 1 via ORAL
  Filled 2021-08-12 (×3): qty 1

## 2021-08-12 MED ORDER — LORAZEPAM 1 MG PO TABS
1.0000 mg | ORAL_TABLET | ORAL | Status: DC | PRN
  Administered 2021-08-13 (×2): 2 mg via ORAL
  Filled 2021-08-12 (×2): qty 2

## 2021-08-12 MED ORDER — POTASSIUM CHLORIDE CRYS ER 20 MEQ PO TBCR
40.0000 meq | EXTENDED_RELEASE_TABLET | Freq: Once | ORAL | Status: AC
  Administered 2021-08-12: 40 meq via ORAL
  Filled 2021-08-12: qty 2

## 2021-08-12 MED ORDER — LORAZEPAM 2 MG/ML IJ SOLN
1.0000 mg | INTRAMUSCULAR | Status: DC | PRN
  Administered 2021-08-13: 05:00:00 2 mg via INTRAVENOUS
  Filled 2021-08-12: qty 1

## 2021-08-12 MED ORDER — MORPHINE SULFATE (PF) 2 MG/ML IV SOLN: 1.0000 mg | INTRAVENOUS | Status: DC | PRN

## 2021-08-12 NOTE — Progress Notes (Signed)
   08/12/21 2236  Assess: MEWS Score  BP (!) 149/110  Pulse Rate (!) 112  SpO2 99 %  Assess: MEWS Score  MEWS Temp 0  MEWS Systolic 0  MEWS Pulse 2  MEWS RR 0  MEWS LOC 0  MEWS Score 2  MEWS Score Color Yellow  Assess: if the MEWS score is Yellow or Red  Were vital signs taken at a resting state? Yes  Focused Assessment Change from prior assessment (see assessment flowsheet)  Does the patient meet 2 or more of the SIRS criteria? Yes  Does the patient have a confirmed or suspected source of infection? No  Provider and Rapid Response Notified? No  MEWS guidelines implemented *See Row Information* No, vital signs rechecked  Treat  MEWS Interventions Administered prn meds/treatments  Take Vital Signs  Increase Vital Sign Frequency  Yellow: Q 2hr X 2 then Q 4hr X 2, if remains yellow, continue Q 4hrs  Escalate  MEWS: Escalate Yellow: discuss with charge nurse/RN and consider discussing with provider and RRT  Notify: Charge Nurse/RN  Name of Charge Nurse/RN Notified Stacy Clay,rn  Date Charge Nurse/RN Notified 08/12/21  Time Charge Nurse/RN Notified 2243  Document  Patient Outcome Not stable and remains on department  Assess: SIRS CRITERIA  SIRS Temperature  0  SIRS Pulse 1  SIRS Respirations  0  SIRS WBC 0  SIRS Score Sum  1

## 2021-08-12 NOTE — Progress Notes (Signed)
Pt arrived to room 228 via bed from the ED. Received report from [place], Therapist, sports. See assessment. Will continue to monitor.

## 2021-08-12 NOTE — Progress Notes (Signed)
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 Continue on clear liquid diet. IV zofran prn for nausea/vomiting.   Alcohol use disorder: alcohol cessation counseling. Urine drug screen & ethanol level ordered   HTN: continue on amlodipine    Hx of small cell lung cancer:  s/p chemoradiotherapy. CT suggesting left lower lobe necrosis. Pt made aware of CT findings and pt will f/u outpatient w/ onco   AKI on CKDIIIb: baseline Cr of 1.67. Cr is trending up from day prior. Continue on IVFs   DVT prophylaxis: lovenox  Code Status: full  Family Communication:  Disposition Plan: depends on PT/OT recs  Level of care: Med-Surg  Status is: Inpatient  Remains inpatient appropriate because: severity of illness      Consultants:    Procedures:   Antimicrobials:   Subjective: Pt c/o abd pain   Objective: Vitals:   08/12/21 1300 08/12/21 1350 08/12/21 1400 08/12/21 1446  BP: (!) 152/104 (!) 163/112 (!) 156/116 (!) 149/103  Pulse: 68 (!) 108 (!) 112 (!) 103  Resp:  18 14 17   Temp:      TempSrc:      SpO2: 100% 100% 100% 100%  Weight:      Height:        Intake/Output Summary (Last 24 hours) at 08/12/2021 1447 Last data filed at 08/12/2021 0700 Gross per 24 hour  Intake 1072.72 ml  Output --  Net 1072.72 ml   Filed Weights   08/11/21 1617  Weight: 86 kg    Examination:  General exam: Appears calm and comfortable  Respiratory system: Clear to auscultation. Respiratory effort normal. Cardiovascular system: S1 & S2 +. No  rubs, gallops or clicks.  Gastrointestinal system: Abdomen is nondistended, soft and tenderness  to palpation. Hypoactive bowel sounds heard. Central nervous system: Alert and oriented. Moves all extremities  Psychiatry: Judgement and insight appear normal. Flat mood and affect    Data Reviewed: I have personally reviewed following labs and imaging studies  CBC: Recent Labs  Lab 08/11/21 1631  WBC 12.1*  NEUTROABS 9.8*  HGB 15.1  HCT 42.7  MCV 96.8  PLT 341   Basic Metabolic Panel: Recent Labs  Lab 08/11/21 1631 08/12/21 0753  NA 131* 134*  K 3.3* 3.8  CL 96* 103  CO2 23 24  GLUCOSE 128* 74  BUN 26* 22*  CREATININE 2.25* 1.65*  CALCIUM 9.3 8.5*   GFR: Estimated Creatinine Clearance: 55.4 mL/min (A) (by C-G formula based on SCr of 1.65 mg/dL (H)). Liver Function Tests: Recent Labs  Lab 08/11/21 1631  AST 40  ALT 22  ALKPHOS 83  BILITOT 2.1*  PROT 8.8*  ALBUMIN 4.1   Recent Labs  Lab 08/11/21 1631 08/12/21 0753  LIPASE 494* 342*   No results for input(s): AMMONIA in the last 168 hours. Coagulation Profile: No results for input(s): INR, PROTIME in the last 168 hours. Cardiac Enzymes: No results for input(s): CKTOTAL, CKMB, CKMBINDEX, TROPONINI in the last 168 hours. BNP (last 3 results) No results for input(s): PROBNP in the last 8760 hours. HbA1C: No results for input(s): HGBA1C in the last 72 hours. CBG:  No results for input(s): GLUCAP in the last 168 hours. Lipid Profile: No results for input(s): CHOL, HDL, LDLCALC, TRIG, CHOLHDL, LDLDIRECT in the last 72 hours. Thyroid Function Tests: No results for input(s): TSH, T4TOTAL, FREET4, T3FREE, THYROIDAB in the last 72 hours. Anemia Panel: No results for input(s): VITAMINB12, FOLATE, FERRITIN, TIBC, IRON, RETICCTPCT in the last 72 hours. Sepsis Labs: No results for input(s): PROCALCITON, LATICACIDVEN in the last 168 hours.  Recent Results (from the past 240 hour(s))  Resp Panel by RT-PCR (Flu A&B, Covid) Nasopharyngeal Swab     Status: None   Collection Time: 08/12/21 12:00 PM   Specimen:  Nasopharyngeal Swab; Nasopharyngeal(NP) swabs in vial transport medium  Result Value Ref Range Status   SARS Coronavirus 2 by RT PCR NEGATIVE NEGATIVE Final    Comment: (NOTE) SARS-CoV-2 target nucleic acids are NOT DETECTED.  The SARS-CoV-2 RNA is generally detectable in upper respiratory specimens during the acute phase of infection. The lowest concentration of SARS-CoV-2 viral copies this assay can detect is 138 copies/mL. A negative result does not preclude SARS-Cov-2 infection and should not be used as the sole basis for treatment or other patient management decisions. A negative result may occur with  improper specimen collection/handling, submission of specimen other than nasopharyngeal swab, presence of viral mutation(s) within the areas targeted by this assay, and inadequate number of viral copies(<138 copies/mL). A negative result must be combined with clinical observations, patient history, and epidemiological information. The expected result is Negative.  Fact Sheet for Patients:  EntrepreneurPulse.com.au  Fact Sheet for Healthcare Providers:  IncredibleEmployment.be  This test is no t yet approved or cleared by the [place] FDA and  has been authorized for detection and/or diagnosis of SARS-CoV-2 by FDA under an Emergency Use Authorization (EUA). This EUA will remain  in effect (meaning this test can be used) for the duration of the COVID-19 declaration under Section 564(b)(1) of the Act, 21 U.S.C.section 360bbb-3(b)(1), unless the authorization is terminated  or revoked sooner.       Influenza A by PCR NEGATIVE NEGATIVE Final   Influenza B by PCR NEGATIVE NEGATIVE Final    Comment: (NOTE) The Xpert Xpress SARS-CoV-2/FLU/RSV plus assay is intended as an aid in the diagnosis of influenza from Nasopharyngeal swab specimens and should not be used as a sole basis for treatment. Nasal washings and aspirates are unacceptable for  Xpert Xpress SARS-CoV-2/FLU/RSV testing.  Fact Sheet for Patients: EntrepreneurPulse.com.au  Fact Sheet for Healthcare Providers: IncredibleEmployment.be  This test is not yet approved or cleared by the [place] FDA and has been authorized for detection and/or diagnosis of SARS-CoV-2 by FDA under an Emergency Use Authorization (EUA). This EUA will remain in effect (meaning this test can be used) for the duration of the COVID-19 declaration under Section 564(b)(1) of the Act, 21 U.S.C. section 360bbb-3(b)(1), unless the authorization is terminated or revoked.  Performed at [facility], [address] [place], [place] [address]          Radiology Studies: CT ABDOMEN PELVIS WO CONTRAST  Result Date: 08/11/2021 CLINICAL DATA:  Distension all pain no bowel movement EXAM: CT ABDOMEN AND PELVIS WITHOUT CONTRAST TECHNIQUE: Multidetector CT imaging of the abdomen and pelvis was performed following the standard protocol without IV contrast. COMPARISON:  Radiograph 08/11/2021, CT 01/28/2021, chest CT 02/18/2018 FINDINGS: Lower chest: Lung bases demonstrate small left-sided pleural effusion. Persistent area of airspace consolidation within the left lower lobe with areas of central low attenuation suggestive of necrosis. There is  mild left lower lobe bronchiectasis. Hepatobiliary: No focal liver abnormality is seen. No gallstones, gallbladder wall thickening, or biliary dilatation. Pancreas: Moderate peripancreatic stranding and fluid consistent with acute pancreatitis. Spleen: Atrophic in appearance. Adrenals/Urinary Tract: Adrenal glands are normal. Kidneys show no hydronephrosis. The bladder is slightly thick walled Stomach/Bowel: The stomach is nonenlarged. No dilated small bowel. No acute bowel wall thickening. Negative appendix. Vascular/Lymphatic: Mild aortic atherosclerosis. No aneurysm. No suspicious nodes. Reproductive: Prostate is  unremarkable. Other: Negative for pelvic effusion or free air. Musculoskeletal: Degenerative changes. No acute osseous abnormality. IMPRESSION: 1. Findings consistent with acute pancreatitis. No gross organized fluid collections allowing for absence of contrast 2. Persistent area of consolidation in the left lower lobe with areas of central low density suggestive of necrosis. Pulmonary consultation is recommended as neoplasm cannot be excluded given findings on prior CT chest imaging. Electronically Signed   By: Donavan Foil M.D.   On: 08/11/2021 19:08   DG Abd 2 Views  Result Date: 08/11/2021 CLINICAL DATA:  Abdomen pain EXAM: ABDOMEN - 2 VIEW COMPARISON:  CT 01/28/2021 FINDINGS: No free air beneath the diaphragm. Mild to moderate diffuse gaseous dilatation of small and large bowel with scattered rectal gas. Phleboliths in the pelvis. IMPRESSION: Mild to moderate diffuse gaseous dilatation of the bowel suggestive of ileus or enteritis. Electronically Signed   By: Donavan Foil M.D.   On: 08/11/2021 17:36        Scheduled Meds:  amLODipine  5 mg Oral Daily   enoxaparin (LOVENOX) injection  40 mg Subcutaneous Q24H   Continuous Infusions:  sodium chloride 100 mL/hr at 08/12/21 0900     LOS: 1 day    Time spent: 33 mins     Wyvonnia Dusky, MD [facility] Pager [phone]  If 7PM-7AM, please contact night-coverage 08/12/2021, 2:47 PM

## 2021-08-12 NOTE — Plan of Care (Signed)
  Problem: Education: Goal: Knowledge of General Education information will improve Description: Including pain rating scale, medication(s)/side effects and non-pharmacologic comfort measures Outcome: Progressing   Problem: Health Behavior/Discharge Planning: Goal: Ability to manage health-related needs will improve Outcome: Progressing   Problem: Pain Managment: Goal: General experience of comfort will improve Outcome: Progressing   Problem: Safety: Goal: Ability to remain free from injury will improve Outcome: Progressing   Problem: Skin Integrity: Goal: Risk for impaired skin integrity will decrease Outcome: Progressing   Problem: Education: Goal: Knowledge of disease or condition will improve Outcome: Progressing Goal: Understanding of discharge needs will improve Outcome: Progressing   Problem: Health Behavior/Discharge Planning: Goal: Ability to identify changes in lifestyle to reduce recurrence of condition will improve Outcome: Progressing

## 2021-08-12 NOTE — Progress Notes (Signed)
MD notified of hypertension and elevated heart rate. Order for PRN hydralazine with parameters placed.

## 2021-08-13 DIAGNOSIS — F1990 Other psychoactive substance use, unspecified, uncomplicated: Secondary | ICD-10-CM | POA: Diagnosis not present

## 2021-08-13 DIAGNOSIS — F1020 Alcohol dependence, uncomplicated: Secondary | ICD-10-CM | POA: Diagnosis not present

## 2021-08-13 DIAGNOSIS — K852 Alcohol induced acute pancreatitis without necrosis or infection: Secondary | ICD-10-CM | POA: Diagnosis not present

## 2021-08-13 LAB — CBC
HCT: 31.9 % — ABNORMAL LOW (ref 39.0–52.0)
Hemoglobin: 11 g/dL — ABNORMAL LOW (ref 13.0–17.0)
MCH: 33.8 pg (ref 26.0–34.0)
MCHC: 34.5 g/dL (ref 30.0–36.0)
MCV: 98.2 fL (ref 80.0–100.0)
Platelets: 296 10*3/uL (ref 150–400)
RBC: 3.25 MIL/uL — ABNORMAL LOW (ref 4.22–5.81)
RDW: 14.1 % (ref 11.5–15.5)
WBC: 9.8 10*3/uL (ref 4.0–10.5)
nRBC: 0 % (ref 0.0–0.2)

## 2021-08-13 LAB — BASIC METABOLIC PANEL
Anion gap: 6 (ref 5–15)
BUN: 16 mg/dL (ref 6–20)
CO2: 24 mmol/L (ref 22–32)
Calcium: 8.3 mg/dL — ABNORMAL LOW (ref 8.9–10.3)
Chloride: 102 mmol/L (ref 98–111)
Creatinine, Ser: 1.26 mg/dL — ABNORMAL HIGH (ref 0.61–1.24)
GFR, Estimated: >60 mL/min (ref >60)
Glucose, Bld: 82 mg/dL (ref 70–99)
Potassium: 3.3 mmol/L — ABNORMAL LOW (ref 3.5–5.1)
Sodium: 132 mmol/L — ABNORMAL LOW (ref 135–145)

## 2021-08-13 LAB — LIPASE, BLOOD: Lipase: 253 U/L — ABNORMAL HIGH (ref 11–51)

## 2021-08-13 MED ORDER — POTASSIUM CHLORIDE CRYS ER 20 MEQ PO TBCR
40.0000 meq | EXTENDED_RELEASE_TABLET | Freq: Once | ORAL | Status: AC
  Administered 2021-08-13: 10:00:00 40 meq via ORAL
  Filled 2021-08-13: qty 2

## 2021-08-13 MED ORDER — POLYETHYLENE GLYCOL 3350 17 G PO PACK
17.0000 g | PACK | Freq: Every day | ORAL | Status: DC
  Administered 2021-08-13 – 2021-08-14 (×2): 17 g via ORAL
  Filled 2021-08-13 (×2): qty 1

## 2021-08-13 NOTE — Plan of Care (Signed)
  Problem: Education: Goal: Knowledge of General Education information will improve Description: Including pain rating scale, medication(s)/side effects and non-pharmacologic comfort measures Outcome: Progressing   Problem: Health Behavior/Discharge Planning: Goal: Ability to manage health-related needs will improve Outcome: Progressing   Problem: Clinical Measurements: Goal: Ability to maintain clinical measurements within normal limits will improve Outcome: Progressing Goal: Diagnostic test results will improve Outcome: Progressing Goal: Respiratory complications will improve Outcome: Progressing Goal: Cardiovascular complication will be avoided Outcome: Progressing   Problem: Activity: Goal: Risk for activity intolerance will decrease Outcome: Progressing   Problem: Nutrition: Goal: Adequate nutrition will be maintained Outcome: Progressing   Problem: Elimination: Goal: Will not experience complications related to urinary retention Outcome: Progressing   Problem: Pain Managment: Goal: General experience of comfort will improve Outcome: Progressing   Problem: Safety: Goal: Ability to remain free from injury will improve Outcome: Progressing   Problem: Skin Integrity: Goal: Risk for impaired skin integrity will decrease Outcome: Progressing

## 2021-08-13 NOTE — Progress Notes (Signed)
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 Advanced diet to soft diet. Zofran prn nausea/vomiting  Alcohol use disorder: alcohol cessation counseling. Started on CIWA protocol  Illicit drug abuse: urine drug screen was positive for cocaine. Illicit drug use cessation counseling   Hypokalemia: KCl repleated.  HTN: continue on CCB   Hx of small cell lung cancer:  s/p chemoradiotherapy. CT suggesting left lower lobe necrosis. Pt made aware of CT findings and pt will f/u outpatient w/ onco. Pt verbalized his understanding again today   AKI on CKDIIIb: baseline Cr 1.67. Cr is trending down from day prior. Continue on IVFs   DVT prophylaxis: lovenox  Code Status: full  Family Communication:  Disposition Plan: likely d/c back home   Level of care: Med-Surg  Status is: Inpatient  Remains inpatient appropriate because: severity of illness      Consultants:    Procedures:   Antimicrobials:   Subjective: Pt c/o intermittent nausea   Objective: Vitals:   08/12/21 2236 08/13/21 0049 08/13/21 0247 08/13/21 0457  BP: (!) 149/110 (!) 149/101 (!) 148/104 (!) 139/106  Pulse: (!) 112 (!) 114 (!) 113 (!) 117  Resp:      Temp:  99.3 F (37.4 C) 98.7 F (37.1 C) 99.3 F (37.4 C)  TempSrc:  Oral Oral Oral  SpO2: 99% 100% 99% 100%  Weight:      Height:        Intake/Output Summary (Last 24 hours) at 08/13/2021 0808 Last data filed at 08/12/2021 1800 Gross per 24 hour  Intake 1255.49 ml  Output --  Net 1255.49 ml   Filed Weights   08/11/21 1617  Weight: 86 kg    Examination:  General exam: Appears comfortable   Respiratory system: clear breath sounds b/l  Cardiovascular system: S1/S2+. No rubs or gallops  Gastrointestinal system: Abd is soft, tenderness to palpation, hypoactive bowel sounds  Central nervous system: alert and oriented. Moves all extremities  Psychiatry: Judgement and insight appears normal. Flat mood and affect    Data Reviewed: I have personally reviewed following labs and imaging studies  CBC: Recent Labs  Lab 08/11/21 1631 08/13/21 0532  WBC 12.1* 9.8  NEUTROABS 9.8*  --   HGB 15.1 11.0*  HCT 42.7 31.9*  MCV 96.8 98.2  PLT 384 580   Basic Metabolic Panel: Recent Labs  Lab 08/11/21 1631 08/12/21 0753 08/13/21 0532  NA 131* 134* 132*  K 3.3* 3.8 3.3*  CL 96* 103 102  CO2 23 24 24   GLUCOSE 128* 74 82  BUN 26* 22* 16  CREATININE 2.25* 1.65* 1.26*  CALCIUM 9.3 8.5* 8.3*   GFR: Estimated Creatinine Clearance: 72.5 mL/min (A) (by C-G formula based on SCr of 1.26 mg/dL (H)). Liver Function Tests: Recent Labs  Lab 08/11/21 1631  AST 40  ALT 22  ALKPHOS 83  BILITOT 2.1*  PROT 8.8*  ALBUMIN 4.1   Recent Labs  Lab 08/11/21 1631 08/12/21 0753 08/13/21 0532  LIPASE 494* 342* 253*   No results for input(s): AMMONIA in the last 168 hours. Coagulation Profile: No results for input(s): INR, PROTIME in the last 168  hours. Cardiac Enzymes: No results for input(s): CKTOTAL, CKMB, CKMBINDEX, TROPONINI in the last 168 hours. BNP (last 3 results) No results for input(s): PROBNP in the last 8760 hours. HbA1C: No results for input(s): HGBA1C in the last 72 hours. CBG: No results for input(s): GLUCAP in the last 168 hours. Lipid Profile: No results for input(s): CHOL, HDL, LDLCALC, TRIG, CHOLHDL, LDLDIRECT in the last 72 hours. Thyroid Function Tests: No results for input(s): TSH, T4TOTAL, FREET4, T3FREE, THYROIDAB in the last 72 hours. Anemia Panel: No results for input(s): VITAMINB12, FOLATE, FERRITIN, TIBC, IRON, RETICCTPCT in the last 72 hours. Sepsis  Labs: No results for input(s): PROCALCITON, LATICACIDVEN in the last 168 hours.  Recent Results (from the past 240 hour(s))  Resp Panel by RT-PCR (Flu A&B, Covid) Nasopharyngeal Swab     Status: None   Collection Time: 08/12/21 12:00 PM   Specimen: Nasopharyngeal Swab; Nasopharyngeal(NP) swabs in vial transport medium  Result Value Ref Range Status   SARS Coronavirus 2 by RT PCR NEGATIVE NEGATIVE Final    Comment: (NOTE) SARS-CoV-2 target nucleic acids are NOT DETECTED.  The SARS-CoV-2 RNA is generally detectable in upper respiratory specimens during the acute phase of infection. The lowest concentration of SARS-CoV-2 viral copies this assay can detect is 138 copies/mL. A negative result does not preclude SARS-Cov-2 infection and should not be used as the sole basis for treatment or other patient management decisions. A negative result may occur with  improper specimen collection/handling, submission of specimen other than nasopharyngeal swab, presence of viral mutation(s) within the areas targeted by this assay, and inadequate number of viral copies(<138 copies/mL). A negative result must be combined with clinical observations, patient history, and epidemiological information. The expected result is Negative.  Fact Sheet for Patients:  EntrepreneurPulse.com.au  Fact Sheet for Healthcare Providers:  IncredibleEmployment.be  This test is no t yet approved or cleared by the [place] FDA and  has been authorized for detection and/or diagnosis of SARS-CoV-2 by FDA under an Emergency Use Authorization (EUA). This EUA will remain  in effect (meaning this test can be used) for the duration of the COVID-19 declaration under Section 564(b)(1) of the Act, 21 U.S.C.section 360bbb-3(b)(1), unless the authorization is terminated  or revoked sooner.       Influenza A by PCR NEGATIVE NEGATIVE Final   Influenza B by PCR NEGATIVE NEGATIVE Final     Comment: (NOTE) The Xpert Xpress SARS-CoV-2/FLU/RSV plus assay is intended as an aid in the diagnosis of influenza from Nasopharyngeal swab specimens and should not be used as a sole basis for treatment. Nasal washings and aspirates are unacceptable for Xpert Xpress SARS-CoV-2/FLU/RSV testing.  Fact Sheet for Patients: EntrepreneurPulse.com.au  Fact Sheet for Healthcare Providers: IncredibleEmployment.be  This test is not yet approved or cleared by the [place] FDA and has been authorized for detection and/or diagnosis of SARS-CoV-2 by FDA under an Emergency Use Authorization (EUA). This EUA will remain in effect (meaning this test can be used) for the duration of the COVID-19 declaration under Section 564(b)(1) of the Act, 21 U.S.C. section 360bbb-3(b)(1), unless the authorization is terminated or revoked.  Performed at [facility], [address] [place], [place] [address]          Radiology Studies: CT ABDOMEN PELVIS WO CONTRAST  Result Date: 08/11/2021 CLINICAL DATA:  Distension all pain no bowel movement EXAM: CT ABDOMEN AND PELVIS WITHOUT CONTRAST TECHNIQUE: Multidetector CT imaging of the abdomen and pelvis was performed following the standard protocol without  IV contrast. COMPARISON:  Radiograph 08/11/2021, CT 01/28/2021, chest CT 02/18/2018 FINDINGS: Lower chest: Lung bases demonstrate small left-sided pleural effusion. Persistent area of airspace consolidation within the left lower lobe with areas of central low attenuation suggestive of necrosis. There is mild left lower lobe bronchiectasis. Hepatobiliary: No focal liver abnormality is seen. No gallstones, gallbladder wall thickening, or biliary dilatation. Pancreas: Moderate peripancreatic stranding and fluid consistent with acute pancreatitis. Spleen: Atrophic in appearance. Adrenals/Urinary Tract: Adrenal glands are normal. Kidneys show no hydronephrosis. The bladder  is slightly thick walled Stomach/Bowel: The stomach is nonenlarged. No dilated small bowel. No acute bowel wall thickening. Negative appendix. Vascular/Lymphatic: Mild aortic atherosclerosis. No aneurysm. No suspicious nodes. Reproductive: Prostate is unremarkable. Other: Negative for pelvic effusion or free air. Musculoskeletal: Degenerative changes. No acute osseous abnormality. IMPRESSION: 1. Findings consistent with acute pancreatitis. No gross organized fluid collections allowing for absence of contrast 2. Persistent area of consolidation in the left lower lobe with areas of central low density suggestive of necrosis. Pulmonary consultation is recommended as neoplasm cannot be excluded given findings on prior CT chest imaging. Electronically Signed   By: Donavan Foil M.D.   On: 08/11/2021 19:08   DG Abd 2 Views  Result Date: 08/11/2021 CLINICAL DATA:  Abdomen pain EXAM: ABDOMEN - 2 VIEW COMPARISON:  CT 01/28/2021 FINDINGS: No free air beneath the diaphragm. Mild to moderate diffuse gaseous dilatation of small and large bowel with scattered rectal gas. Phleboliths in the pelvis. IMPRESSION: Mild to moderate diffuse gaseous dilatation of the bowel suggestive of ileus or enteritis. Electronically Signed   By: Donavan Foil M.D.   On: 08/11/2021 17:36        Scheduled Meds:  amLODipine  5 mg Oral Daily   enoxaparin (LOVENOX) injection  40 mg Subcutaneous V78I   folic acid  1 mg Oral Daily   multivitamin with minerals  1 tablet Oral Daily   thiamine  100 mg Oral Daily   Or   thiamine  100 mg Intravenous Daily   Continuous Infusions:  sodium chloride 100 mL/hr at 08/13/21 0207     LOS: 2 days    Time spent: 30 mins     Wyvonnia Dusky, MD [facility] Pager [phone]  If 7PM-7AM, please contact night-coverage 08/13/2021, 8:08 AM

## 2021-08-13 NOTE — Progress Notes (Signed)
Lab reports a abnormal hemoglobin drop from his admission baseline. Lab will redraw hemoglobin stat

## 2021-08-13 NOTE — Evaluation (Signed)
Physical Therapy Evaluation Patient Details Name: Jon Osborne MRN: [id number] DOB: [birth year] Today's Date: 08/13/2021  History of Present Illness  Jon Osborne is a 60 y.o. male with medical history significant for  alcohol use disorder, small cell lung cancer s/p chemoradiation followed by oncology at [facility], CKD stage IIIb and hypertension who presents with acute alcoholic pancreatitis.  Clinical Impression  The pt is presenting this session with significant balance deficits. He presents with increased sway with eyes closed activity. He also scores 20/28 on the Tinetti Balance indicating Moderate risk of falls. The pt's 5xSTS was 14.5s also indicative of increased risk of falls. There was no family present during this session in order to determine how this presentation compares to his baseline level of function. At this time it is expected that he will progress to d/c home with HHPT.      Recommendations for follow up therapy are one component of a multi-disciplinary discharge planning process, led by the attending physician.  Recommendations may be updated based on patient status, additional functional criteria and insurance authorization.  Follow Up Recommendations Home health PT    Assistance Recommended at Discharge PRN  Functional Status Assessment Patient has had a recent decline in their functional status and demonstrates the ability to make significant improvements in function in a reasonable and predictable amount of time.  Equipment Recommendations  None recommended by PT    Recommendations for Other Services       Precautions / Restrictions Precautions Precautions: Fall      Mobility  Bed Mobility Overal bed mobility: Needs Assistance Bed Mobility: Supine to Sit     Supine to sit: Supervision          Transfers Overall transfer level: Needs assistance Equipment used: None Transfers: Sit to/from Stand Sit to Stand: Supervision                 Ambulation/Gait Ambulation/Gait assistance: Min assist Gait Distance (Feet): 100 Feet Assistive device: None Gait Pattern/deviations: Drifts right/left          Stairs            Wheelchair Mobility    Modified Rankin (Stroke Patients Only)       Balance Overall balance assessment: Needs assistance Sitting-balance support: Feet supported;No upper extremity supported Sitting balance-Leahy Scale: Normal     Standing balance support: During functional activity Standing balance-Leahy Scale: Fair                   Standardized Balance Assessment Standardized Balance Assessment :  (5xSTS: 14.5s; tandem x6s EO, unsafe with EC)           Pertinent Vitals/Pain Pain Assessment: 0-10 Pain Score: 8  Pain Location: abdomen Pain Descriptors / Indicators: Cramping Pain Intervention(s): Monitored during session    Home Living Family/patient expects to be discharged to:: Private residence Living Arrangements: Spouse/significant other Available Help at Discharge: Family Type of Home: House Home Access: Ramped entrance       Home Layout: One level Home Equipment: None      Prior Function Prior Level of Function : Independent/Modified Independent             Mobility Comments: Per patient report he is mobilizing at his baseline, however significant balance deficits noted.       Hand Dominance   Dominant Hand: Right    Extremity/Trunk Assessment   Upper Extremity Assessment Upper Extremity Assessment: Defer to OT evaluation    Lower Extremity Assessment  Lower Extremity Assessment: Overall WFL for tasks assessed    Cervical / Trunk Assessment Cervical / Trunk Assessment: Normal  Communication   Communication: No difficulties  Cognition Arousal/Alertness: Awake/alert Behavior During Therapy: WFL for tasks assessed/performed Overall Cognitive Status: Within Functional Limits for tasks assessed                                           General Comments      Exercises     Assessment/Plan    PT Assessment Patient needs continued PT services  PT Problem List Decreased balance       PT Treatment Interventions Gait training;Balance training    PT Goals (Current goals can be found in the Care Plan section)  Acute Rehab PT Goals Patient Stated Goal: return home PT Goal Formulation: With patient Time For Goal Achievement: 08/27/21 Potential to Achieve Goals: Good    Frequency Min 2X/week   Barriers to discharge        Co-evaluation               AM-PAC PT "6 Clicks" Mobility  Outcome Measure Help needed turning from your back to your side while in a flat bed without using bedrails?: None Help needed moving from lying on your back to sitting on the side of a flat bed without using bedrails?: None Help needed moving to and from a bed to a chair (including a wheelchair)?: A Little Help needed standing up from a chair using your arms (e.g., wheelchair or bedside chair)?: None Help needed to walk in hospital room?: A Little Help needed climbing 3-5 steps with a railing? : A Little 6 Click Score: 21    End of Session Equipment Utilized During Treatment: Gait belt Activity Tolerance: Patient tolerated treatment well Patient left: in chair;with chair alarm set;with call bell/phone within reach Nurse Communication: Mobility status PT Visit Diagnosis: Unsteadiness on feet (R26.81)    Time: 2202-5427 PT Time Calculation (min) (ACUTE ONLY): 24 min   Charges:   PT Evaluation $PT Eval Low Complexity: 1 Low PT Treatments $Therapeutic Activity: 8-22 mins        11:59 AM, 08/13/21 Tajae Maiolo A. Saverio Danker PT, DPT Physical Therapist - [facility]   Daxtin Leiker A Ambar Raphael 08/13/2021, 11:54 AM

## 2021-08-14 DIAGNOSIS — F1410 Cocaine abuse, uncomplicated: Secondary | ICD-10-CM

## 2021-08-14 DIAGNOSIS — F1020 Alcohol dependence, uncomplicated: Secondary | ICD-10-CM | POA: Diagnosis not present

## 2021-08-14 DIAGNOSIS — K852 Alcohol induced acute pancreatitis without necrosis or infection: Secondary | ICD-10-CM | POA: Diagnosis not present

## 2021-08-14 LAB — BASIC METABOLIC PANEL
Anion gap: 6 (ref 5–15)
BUN: 17 mg/dL (ref 6–20)
CO2: 25 mmol/L (ref 22–32)
Calcium: 8.5 mg/dL — ABNORMAL LOW (ref 8.9–10.3)
Chloride: 104 mmol/L (ref 98–111)
Creatinine, Ser: 1.57 mg/dL — ABNORMAL HIGH (ref 0.61–1.24)
GFR, Estimated: 50 mL/min — ABNORMAL LOW (ref >60)
Glucose, Bld: 105 mg/dL — ABNORMAL HIGH (ref 70–99)
Potassium: 3.7 mmol/L (ref 3.5–5.1)
Sodium: 135 mmol/L (ref 135–145)

## 2021-08-14 LAB — CBC
HCT: 31.8 % — ABNORMAL LOW (ref 39.0–52.0)
Hemoglobin: 10.8 g/dL — ABNORMAL LOW (ref 13.0–17.0)
MCH: 33.6 pg (ref 26.0–34.0)
MCHC: 34 g/dL (ref 30.0–36.0)
MCV: 99.1 fL (ref 80.0–100.0)
Platelets: 323 10*3/uL (ref 150–400)
RBC: 3.21 MIL/uL — ABNORMAL LOW (ref 4.22–5.81)
RDW: 14.3 % (ref 11.5–15.5)
WBC: 10.9 10*3/uL — ABNORMAL HIGH (ref 4.0–10.5)
nRBC: 0 % (ref 0.0–0.2)

## 2021-08-14 LAB — LIPASE, BLOOD: Lipase: 254 U/L — ABNORMAL HIGH (ref 11–51)

## 2021-08-14 NOTE — TOC Initial Note (Signed)
Transition of Care [facility]) - Initial/Assessment Note    Patient Details  Name: Jon Osborne MRN: [id number] Date of Birth: May 24, 1961  [facility] CM/SW Contact:    Beverly Sessions, RN Phone Number: 08/14/2021, 12:45 PM  Clinical Narrative:                  Patient admitted from home with pancreatitis.  Patient to discharge today Wife at Bedside and to transport at discharge  [place].  Denies issues with transportation or obtaining medications Patient declines home health services at discharge.  MD updated Patient declines substance abuse resources   Expected Discharge Plan: Home/Self Care Barriers to Discharge: Barriers Resolved   Patient Goals and CMS Choice        Expected Discharge Plan and Services Expected Discharge Plan: Home/Self Care In-house Referral: Clinical Social Work       Expected Discharge Date: 08/14/21                         HH Arranged: Patient Refused HH          Prior Living Arrangements/Services   Lives with:: Spouse Patient language and need for interpreter reviewed:: Yes Do you feel safe going back to the place where you live?: Yes      Need for Family Participation in Patient Care: Yes (Comment) Care giver support system in place?: Yes (comment)      Activities of Daily Living Home Assistive Devices/Equipment: None ADL Screening (condition at time of admission) Patient's cognitive ability adequate to safely complete daily activities?: Yes Is the patient deaf or have difficulty hearing?: No Does the patient have difficulty seeing, even when wearing glasses/contacts?: No Does the patient have difficulty concentrating, remembering, or making decisions?: No Patient able to express need for assistance with ADLs?: Yes Does the patient have difficulty dressing or bathing?: No Independently performs ADLs?: Yes (appropriate for developmental age) Does the patient have difficulty walking or climbing stairs?:  No Weakness of Legs: None Weakness of Arms/Hands: None  Permission Sought/Granted                  Emotional Assessment Appearance:: Appears older than stated age     Orientation: : Oriented to Self, Oriented to Place, Oriented to  Time, Oriented to Situation Alcohol / Substance Use: Illicit Drugs Psych Involvement: No (comment)  Admission diagnosis:  Acute pancreatitis [K85.90] Abdominal pain [R10.9] Alcohol-induced acute pancreatitis, unspecified complication status [P59.45] Patient Active Problem List   Diagnosis Date Noted   Alcohol use disorder, moderate, dependence ([place]) 08/11/2021   Abnormal CT scan of lung 08/11/2021   Acute kidney injury superimposed on CKD lllb ([place]) 08/11/2021   Alcohol abuse    Gastroesophageal reflux disease    HTN (hypertension)    Acute respiratory failure with hypoxia and hypercapnia ([facility]) 01/28/2021   Acute pancreatitis 01/28/2021   Aortic atherosclerosis ([place]) 01/28/2021   Stage 3b chronic kidney disease ([place]) 01/28/2021   Microcytic anemia 01/28/2021   Hyperammonemia ([place]) 01/28/2021   Normocytic anemia 01/28/2021   PCP:  Ivan Anchors, FNP Pharmacy:  No Pharmacies Listed    Social Determinants of Health (SDOH) Interventions    Readmission Risk Interventions No flowsheet data found.

## 2021-08-14 NOTE — Discharge Summary (Addendum)
Physician Discharge Summary  Jon Osborne IZT:[id number] DOB: Dec 30, 1960 DOA: 08/11/2021  PCP: Ivan Anchors, FNP  Admit date: 08/11/2021 Discharge date: 08/14/2021  Admitted From: home  Disposition:  home   Recommendations for Outpatient Follow-up:  Follow up with PCP in 1-2 weeks F/u w/ oncology in 1-2 days   Home Health: no as pt refused home health  Equipment/Devices:  Discharge Condition: stable  CODE STATUS: full  Diet recommendation: Heart Healthy   Brief/Interim Summary: HPI was taken from Dr. Damita Dunnings: Jon Osborne is a 60 y.o. male with medical history significant for  alcohol use disorder, small cell lung cancer s/p chemoradiation followed by oncology at [facility], CKD stage IIIb and hypertension who presents to the ED with a 4-day history of abdominal pain typical of a prior episode of acute pancreatitis.  States the pain is severe and generalized, nonradiating associated with  Nausea and  Constipation.  Denies fever or chills. denies cough, shortness of breath or chest pain and denies dysuria.   ED course: On arrival BP 152/129 with otherwise normal vitals Blood work significant for WBC of 12,000, creatinine of 2.25 above baseline of 1.67, and lipase 494.  Total bilirubin 2.1 with otherwise normal LFTs.  Urinalysis unremarkable.   CT abdomen and pelvis confirms acute pancreatitis without acute pathology but also showed "Persistent area of consolidation in the left lower lobe ...suggestive of necrosis. Pulmonary consultation is recommended as neoplasm cannot be excluded given findings on prior CT chest imaging"   Patient treated with IV antiemetics, IV pain But continued to have ongoing pain.  Hospitalist consulted for admission   Hospital course as per Dr. Jimmye Norman 11/26-11/28/22: Pt presented w/ pancreatitis likely secondary to alcohol abuse. Pt was treated w/ IVFs, bowel rest, pain & nausea/vomiting meds prn. Pt was tolerating po diet prior to d/c. Pt did received  alcohol cessation counseling as well cocaine cessation counseling as pt's urine drug screen was positive for cocaine & opiates. Pt denied needed any help to stop using cocaine.    Discharge Diagnoses:  Principal Problem:   Acute pancreatitis Active Problems:   HTN (hypertension)   Alcohol use disorder, moderate, dependence (HCC)   Abnormal CT scan of lung   Acute kidney injury superimposed on CKD lllb (HCC)  Acute alcoholic pancreatitis: continue on IVFs. Advanced diet to soft diet. Zofran prn nausea/vomiting  Alcohol use disorder: alcohol cessation counseling. Continue on CIWA protocol  Illicit drug abuse: urine drug screen was positive for cocaine. Illicit drug use cessation counseling   Hypokalemia: WNL today   HTN: continue on CCB   Hx of small cell lung cancer:  s/p chemoradiotherapy. CT suggesting left lower lobe necrosis. Pt made aware of CT findings and pt will f/u outpatient w/ onco. Pt and pt's verbalized their understanding on the day of d/c. Of note, pt continues to smoke cigarettes and has been smoking since the age of 26. Pt refused nicotine patch and/or gum. Smoking cessation counseling   AKI on CKDIIIb: baseline Cr 1.67. Cr is better than baseline on the day of d/c   Discharge Instructions  Discharge Instructions     Diet - low sodium heart healthy   Complete by: As directed    Discharge instructions   Complete by: As directed    F/u w/ oncology in 1-2 days. F/u w/ PCP in 1-2 weeks.   Increase activity slowly   Complete by: As directed       Allergies as of 08/14/2021   No Known Allergies  Medication List     TAKE these medications    acetaminophen 325 MG tablet Commonly known as: TYLENOL Take 2 tablets (650 mg total) by mouth every 6 (six) hours as needed for mild pain or headache (or Fever >/= 101).   amLODipine 10 MG tablet Commonly known as: NORVASC Take 10 mg by mouth daily. What changed: Another medication with the same name was  removed. Continue taking this medication, and follow the directions you see here.   cyanocobalamin 1000 MCG tablet Take 1 tablet (1,000 mcg total) by mouth daily.   folic acid 1 MG tablet Commonly known as: FOLVITE Take 1 tablet (1 mg total) by mouth daily.   multivitamin with minerals Tabs tablet Take 1 tablet by mouth daily.   ondansetron 8 MG disintegrating tablet Commonly known as: Zofran ODT Take 1 tablet (8 mg total) by mouth every 8 (eight) hours as needed for nausea or vomiting.   pantoprazole 40 MG tablet Commonly known as: PROTONIX Take 1 tablet (40 mg total) by mouth daily.        No Known Allergies  Consultations:    Procedures/Studies: CT ABDOMEN PELVIS WO CONTRAST  Result Date: 08/11/2021 CLINICAL DATA:  Distension all pain no bowel movement EXAM: CT ABDOMEN AND PELVIS WITHOUT CONTRAST TECHNIQUE: Multidetector CT imaging of the abdomen and pelvis was performed following the standard protocol without IV contrast. COMPARISON:  Radiograph 08/11/2021, CT 01/28/2021, chest CT 02/18/2018 FINDINGS: Lower chest: Lung bases demonstrate small left-sided pleural effusion. Persistent area of airspace consolidation within the left lower lobe with areas of central low attenuation suggestive of necrosis. There is mild left lower lobe bronchiectasis. Hepatobiliary: No focal liver abnormality is seen. No gallstones, gallbladder wall thickening, or biliary dilatation. Pancreas: Moderate peripancreatic stranding and fluid consistent with acute pancreatitis. Spleen: Atrophic in appearance. Adrenals/Urinary Tract: Adrenal glands are normal. Kidneys show no hydronephrosis. The bladder is slightly thick walled Stomach/Bowel: The stomach is nonenlarged. No dilated small bowel. No acute bowel wall thickening. Negative appendix. Vascular/Lymphatic: Mild aortic atherosclerosis. No aneurysm. No suspicious nodes. Reproductive: Prostate is unremarkable. Other: Negative for pelvic effusion or free  air. Musculoskeletal: Degenerative changes. No acute osseous abnormality. IMPRESSION: 1. Findings consistent with acute pancreatitis. No gross organized fluid collections allowing for absence of contrast 2. Persistent area of consolidation in the left lower lobe with areas of central low density suggestive of necrosis. Pulmonary consultation is recommended as neoplasm cannot be excluded given findings on prior CT chest imaging. Electronically Signed   By: Donavan Foil M.D.   On: 08/11/2021 19:08   DG Abd 2 Views  Result Date: 08/11/2021 CLINICAL DATA:  Abdomen pain EXAM: ABDOMEN - 2 VIEW COMPARISON:  CT 01/28/2021 FINDINGS: No free air beneath the diaphragm. Mild to moderate diffuse gaseous dilatation of small and large bowel with scattered rectal gas. Phleboliths in the pelvis. IMPRESSION: Mild to moderate diffuse gaseous dilatation of the bowel suggestive of ileus or enteritis. Electronically Signed   By: Donavan Foil M.D.   On: 08/11/2021 17:36   (Echo, Carotid, EGD, Colonoscopy, ERCP)    Subjective: Pt denies any complaints    Discharge Exam: Vitals:   08/14/21 0509 08/14/21 0808  BP: (!) 138/101 (!) 145/97  Pulse: (!) 114 (!) 110  Resp: 16 18  Temp: 99.2 F (37.3 C) 98.9 F (37.2 C)  SpO2: 100% 100%   Vitals:   08/13/21 1547 08/13/21 1948 08/14/21 0509 08/14/21 0808  BP: (!) 140/103 (!) 127/92 (!) 138/101 (!) 145/97  Pulse: Marland Kitchen)  114 98 (!) 114 (!) 110  Resp: 18 16 16 18   Temp: 98.1 F (36.7 C) 98.9 F (37.2 C) 99.2 F (37.3 C) 98.9 F (37.2 C)  TempSrc: Oral Oral Oral Oral  SpO2: 100% 99% 100% 100%  Weight:      Height:        General: Pt is alert, awake, not in acute distress Cardiovascular: S1/S2 +, no rubs, no gallops Respiratory: diminished breath sounds b/l  Abdominal: Soft, NT, ND, bowel sounds + Extremities: no edema, no cyanosis    The results of significant diagnostics from this hospitalization (including imaging, microbiology, ancillary and laboratory)  are listed below for reference.     Microbiology: Recent Results (from the past 240 hour(s))  Resp Panel by RT-PCR (Flu A&B, Covid) Nasopharyngeal Swab     Status: None   Collection Time: 08/12/21 12:00 PM   Specimen: Nasopharyngeal Swab; Nasopharyngeal(NP) swabs in vial transport medium  Result Value Ref Range Status   SARS Coronavirus 2 by RT PCR NEGATIVE NEGATIVE Final    Comment: (NOTE) SARS-CoV-2 target nucleic acids are NOT DETECTED.  The SARS-CoV-2 RNA is generally detectable in upper respiratory specimens during the acute phase of infection. The lowest concentration of SARS-CoV-2 viral copies this assay can detect is 138 copies/mL. A negative result does not preclude SARS-Cov-2 infection and should not be used as the sole basis for treatment or other patient management decisions. A negative result may occur with  improper specimen collection/handling, submission of specimen other than nasopharyngeal swab, presence of viral mutation(s) within the areas targeted by this assay, and inadequate number of viral copies(<138 copies/mL). A negative result must be combined with clinical observations, patient history, and epidemiological information. The expected result is Negative.  Fact Sheet for Patients:  EntrepreneurPulse.com.au  Fact Sheet for Healthcare Providers:  IncredibleEmployment.be  This test is no t yet approved or cleared by the [place] FDA and  has been authorized for detection and/or diagnosis of SARS-CoV-2 by FDA under an Emergency Use Authorization (EUA). This EUA will remain  in effect (meaning this test can be used) for the duration of the COVID-19 declaration under Section 564(b)(1) of the Act, 21 U.S.C.section 360bbb-3(b)(1), unless the authorization is terminated  or revoked sooner.       Influenza A by PCR NEGATIVE NEGATIVE Final   Influenza B by PCR NEGATIVE NEGATIVE Final    Comment: (NOTE) The Xpert Xpress  SARS-CoV-2/FLU/RSV plus assay is intended as an aid in the diagnosis of influenza from Nasopharyngeal swab specimens and should not be used as a sole basis for treatment. Nasal washings and aspirates are unacceptable for Xpert Xpress SARS-CoV-2/FLU/RSV testing.  Fact Sheet for Patients: EntrepreneurPulse.com.au  Fact Sheet for Healthcare Providers: IncredibleEmployment.be  This test is not yet approved or cleared by the [place] FDA and has been authorized for detection and/or diagnosis of SARS-CoV-2 by FDA under an Emergency Use Authorization (EUA). This EUA will remain in effect (meaning this test can be used) for the duration of the COVID-19 declaration under Section 564(b)(1) of the Act, 21 U.S.C. section 360bbb-3(b)(1), unless the authorization is terminated or revoked.  Performed at [facility], [address] [place], [place] [address]      Labs: BNP (last 3 results) No results for input(s): BNP in the last 8760 hours. Basic Metabolic Panel: Recent Labs  Lab 08/11/21 1631 08/12/21 0753 08/13/21 0532 08/14/21 0435  NA 131* 134* 132* 135  K 3.3* 3.8 3.3* 3.7  CL 96* 103  102 104  CO2 23 24 24 25   GLUCOSE 128* 74 82 105*  BUN 26* 22* 16 17  CREATININE 2.25* 1.65* 1.26* 1.57*  CALCIUM 9.3 8.5* 8.3* 8.5*   Liver Function Tests: Recent Labs  Lab 08/11/21 1631  AST 40  ALT 22  ALKPHOS 83  BILITOT 2.1*  PROT 8.8*  ALBUMIN 4.1   Recent Labs  Lab 08/11/21 1631 08/12/21 0753 08/13/21 0532 08/14/21 0435  LIPASE 494* 342* 253* 254*   No results for input(s): AMMONIA in the last 168 hours. CBC: Recent Labs  Lab 08/11/21 1631 08/13/21 0532 08/14/21 0435  WBC 12.1* 9.8 10.9*  NEUTROABS 9.8*  --   --   HGB 15.1 11.0* 10.8*  HCT 42.7 31.9* 31.8*  MCV 96.8 98.2 99.1  PLT 384 296 323   Cardiac Enzymes: No results for input(s): CKTOTAL, CKMB, CKMBINDEX, TROPONINI in the last 168 hours. BNP: Invalid  input(s): POCBNP CBG: No results for input(s): GLUCAP in the last 168 hours. D-Dimer No results for input(s): DDIMER in the last 72 hours. Hgb A1c No results for input(s): HGBA1C in the last 72 hours. Lipid Profile No results for input(s): CHOL, HDL, LDLCALC, TRIG, CHOLHDL, LDLDIRECT in the last 72 hours. Thyroid function studies No results for input(s): TSH, T4TOTAL, T3FREE, THYROIDAB in the last 72 hours.  Invalid input(s): FREET3 Anemia work up No results for input(s): VITAMINB12, FOLATE, FERRITIN, TIBC, IRON, RETICCTPCT in the last 72 hours. Urinalysis    Component Value Date/Time   COLORURINE AMBER (A) 08/11/2021 1621   APPEARANCEUR CLEAR (A) 08/11/2021 1621   LABSPEC 1.025 08/11/2021 1621   PHURINE 5.5 08/11/2021 1621   GLUCOSEU NEGATIVE 08/11/2021 1621   HGBUR MODERATE (A) 08/11/2021 1621   BILIRUBINUR MODERATE (A) 08/11/2021 1621   KETONESUR TRACE (A) 08/11/2021 1621   PROTEINUR >300 (A) 08/11/2021 1621   NITRITE NEGATIVE 08/11/2021 1621   LEUKOCYTESUR NEGATIVE 08/11/2021 1621   Sepsis Labs Invalid input(s): PROCALCITONIN,  WBC,  LACTICIDVEN Microbiology Recent Results (from the past 240 hour(s))  Resp Panel by RT-PCR (Flu A&B, Covid) Nasopharyngeal Swab     Status: None   Collection Time: 08/12/21 12:00 PM   Specimen: Nasopharyngeal Swab; Nasopharyngeal(NP) swabs in vial transport medium  Result Value Ref Range Status   SARS Coronavirus 2 by RT PCR NEGATIVE NEGATIVE Final    Comment: (NOTE) SARS-CoV-2 target nucleic acids are NOT DETECTED.  The SARS-CoV-2 RNA is generally detectable in upper respiratory specimens during the acute phase of infection. The lowest concentration of SARS-CoV-2 viral copies this assay can detect is 138 copies/mL. A negative result does not preclude SARS-Cov-2 infection and should not be used as the sole basis for treatment or other patient management decisions. A negative result may occur with  improper specimen collection/handling,  submission of specimen other than nasopharyngeal swab, presence of viral mutation(s) within the areas targeted by this assay, and inadequate number of viral copies(<138 copies/mL). A negative result must be combined with clinical observations, patient history, and epidemiological information. The expected result is Negative.  Fact Sheet for Patients:  EntrepreneurPulse.com.au  Fact Sheet for Healthcare Providers:  IncredibleEmployment.be  This test is no t yet approved or cleared by the [place] FDA and  has been authorized for detection and/or diagnosis of SARS-CoV-2 by FDA under an Emergency Use Authorization (EUA). This EUA will remain  in effect (meaning this test can be used) for the duration of the COVID-19 declaration under Section 564(b)(1) of the Act, 21 U.S.C.section 360bbb-3(b)(1), unless the  authorization is terminated  or revoked sooner.       Influenza A by PCR NEGATIVE NEGATIVE Final   Influenza B by PCR NEGATIVE NEGATIVE Final    Comment: (NOTE) The Xpert Xpress SARS-CoV-2/FLU/RSV plus assay is intended as an aid in the diagnosis of influenza from Nasopharyngeal swab specimens and should not be used as a sole basis for treatment. Nasal washings and aspirates are unacceptable for Xpert Xpress SARS-CoV-2/FLU/RSV testing.  Fact Sheet for Patients: EntrepreneurPulse.com.au  Fact Sheet for Healthcare Providers: IncredibleEmployment.be  This test is not yet approved or cleared by the [place] FDA and has been authorized for detection and/or diagnosis of SARS-CoV-2 by FDA under an Emergency Use Authorization (EUA). This EUA will remain in effect (meaning this test can be used) for the duration of the COVID-19 declaration under Section 564(b)(1) of the Act, 21 U.S.C. section 360bbb-3(b)(1), unless the authorization is terminated or revoked.  Performed at [facility], [address], [place], [place] [address]      Time coordinating discharge: Over 30 minutes  SIGNED:   Wyvonnia Dusky, MD  [facility] 08/14/2021, 10:50 AM Pager   If 7PM-7AM, please contact night-coverage

## 2023-01-24 IMAGING — CT CT ANGIO CHEST
2 of 6 series · 17 of 36 positions shown · IV contrast (Omnipaque or Isovue)
Comparison: CT chest from 02/18/2018

CLINICAL DATA: status post fall 2 days ago. Abdominal pain. Chest
pain and shortness of breath. Elevated blood pressure. Rule out
acute pulmonary embolus.

EXAM:
CT ANGIOGRAPHY CHEST
CT ABDOMEN AND PELVIS WITH CONTRAST
TECHNIQUE: Multidetector CT imaging of the chest was performed using the
standard protocol during bolus administration of intravenous
contrast. Multiplanar CT image reconstructions and MIPs were
obtained to evaluate the vascular anatomy. Multidetector CT imaging
of the abdomen and pelvis was performed using the standard protocol
during bolus administration of intravenous contrast.
CONTRAST:  100mL OMNIPAQUE IOHEXOL 350 MG/ML SOLN

[Series 3: pe axialthins · axial · 0.77mm/px · z∈[+924,+1244]mm · 16 of 444 slices shown]
[im 22/444  lung]
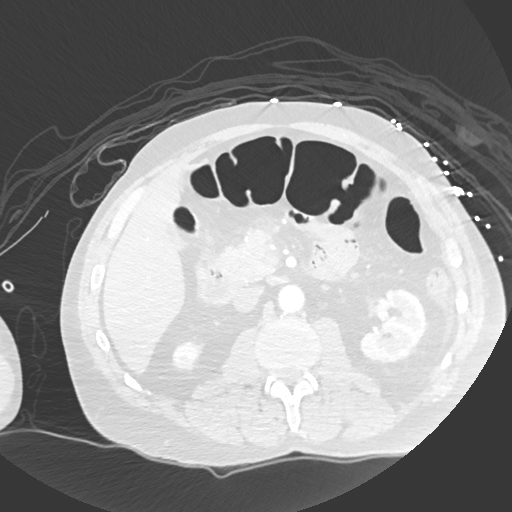
[im 43/444  mediastinal]
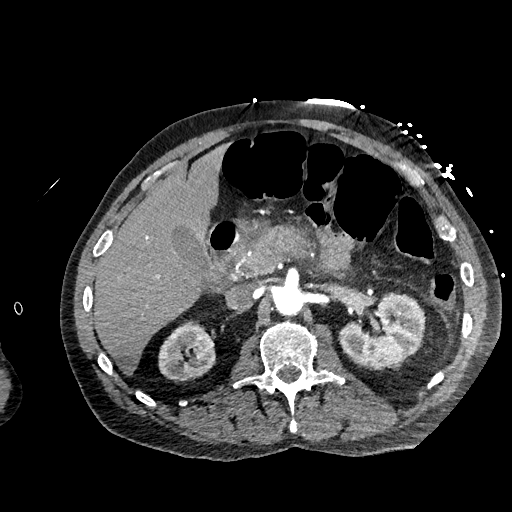
[im 85/444  lung]
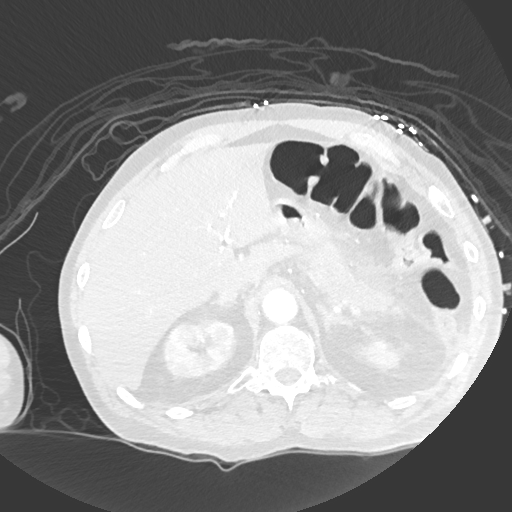
[im 106/444  mediastinal]
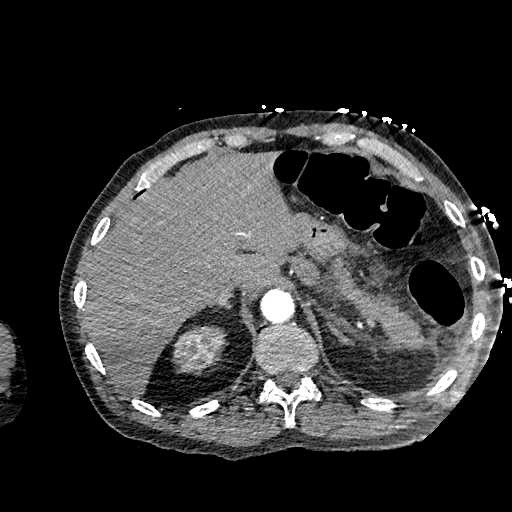
[im 127/444  lung]
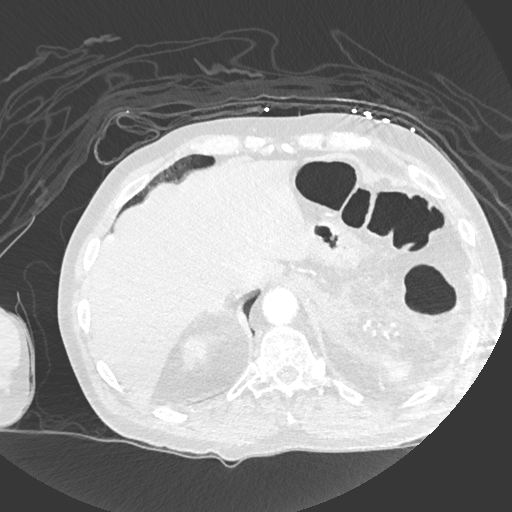
[im 148/444  mediastinal]
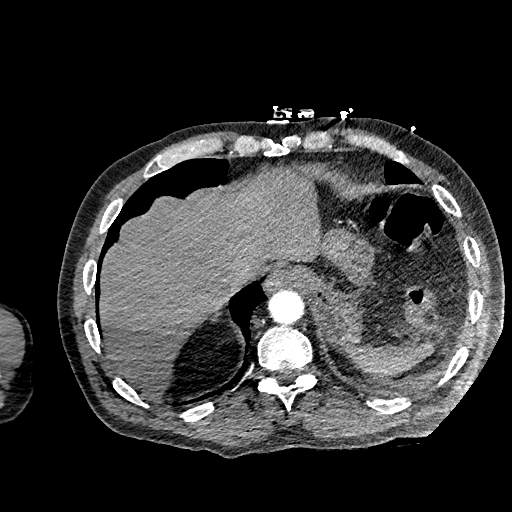
[im 190/444  lung]
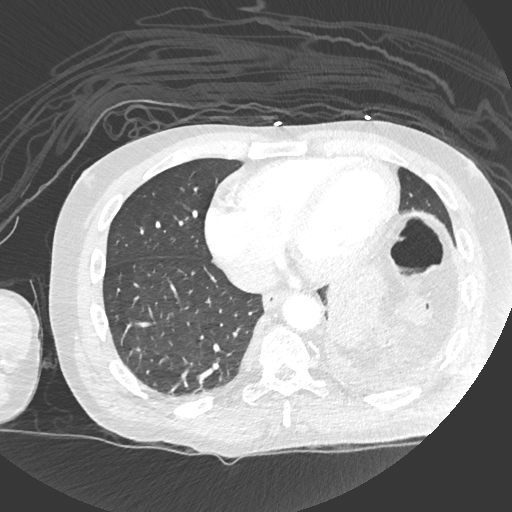
[im 211/444  mediastinal]
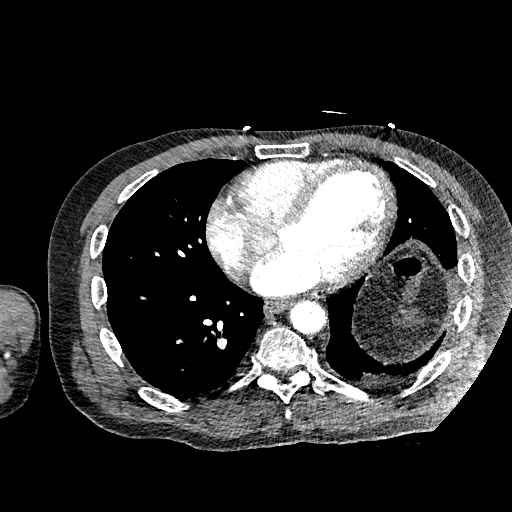
[im 233/444  lung]
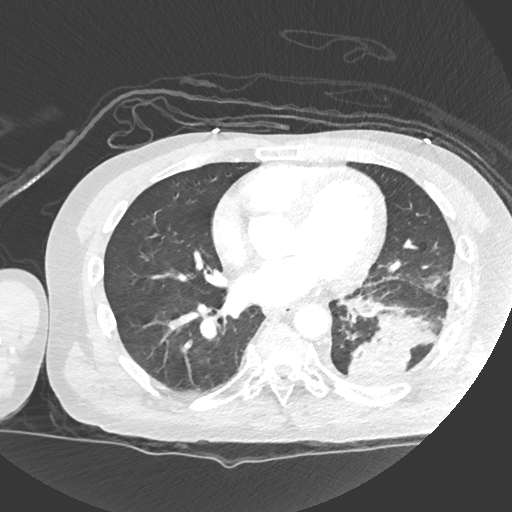
[im 254/444  mediastinal]
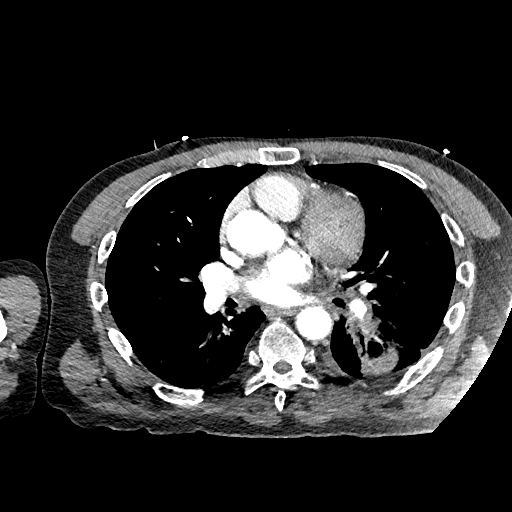
[im 296/444  lung]
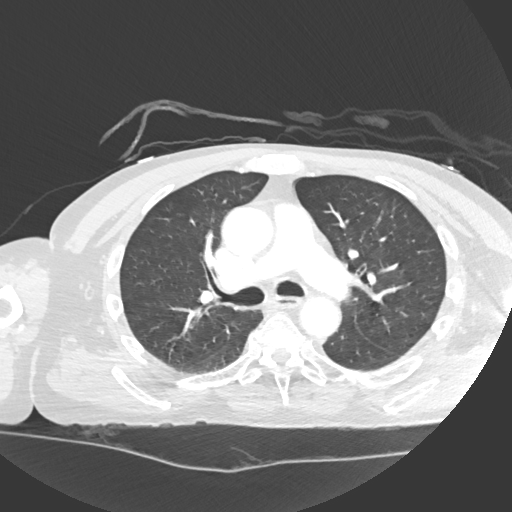
[im 317/444  mediastinal]
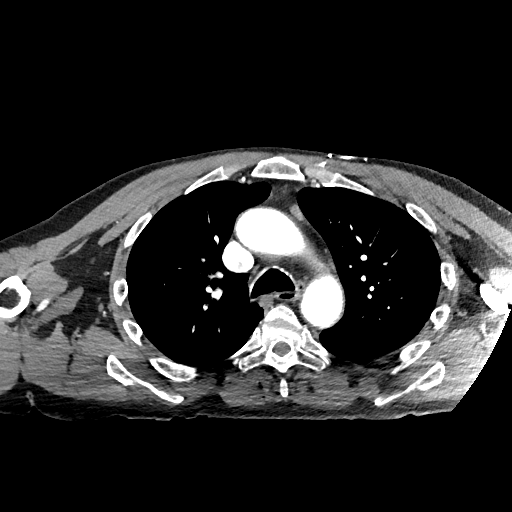
[im 338/444  lung]
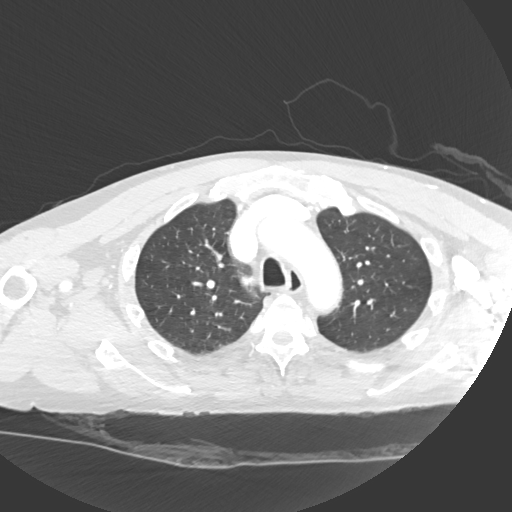
[im 359/444  mediastinal]
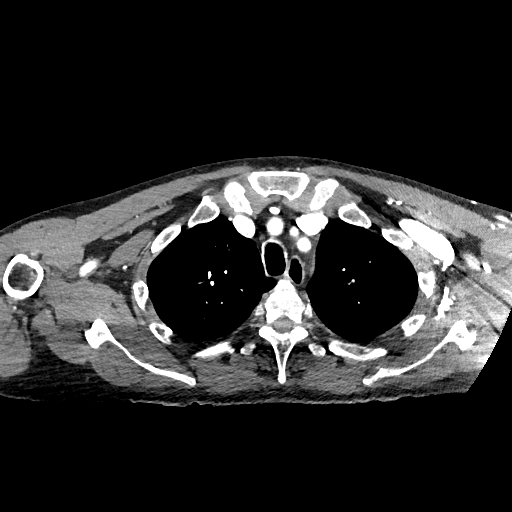
[im 401/444  lung]
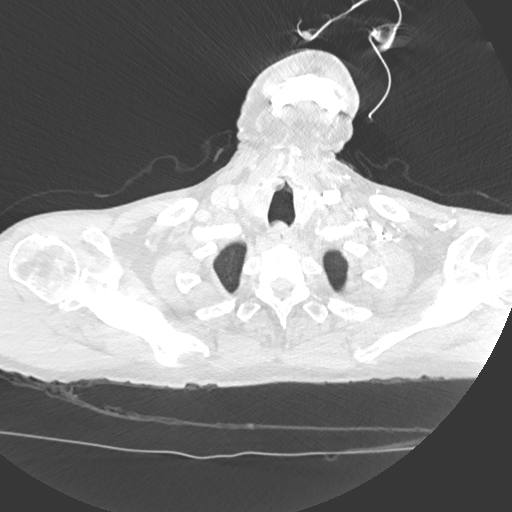
[im 422/444  mediastinal]
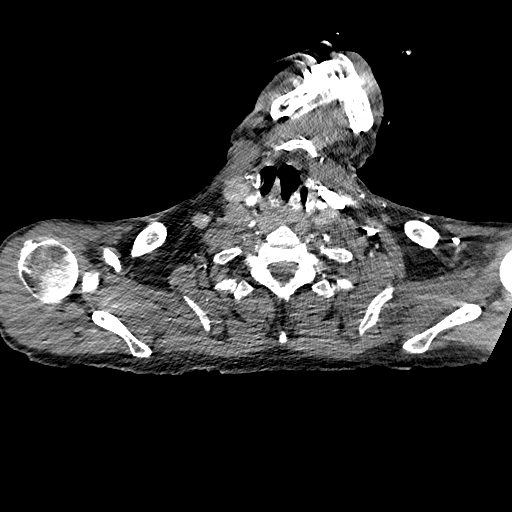

[Series 5: cor soft · coronal · 0.69mm/px · 1 of 152 slices shown]
[im 76/152  mediastinal]
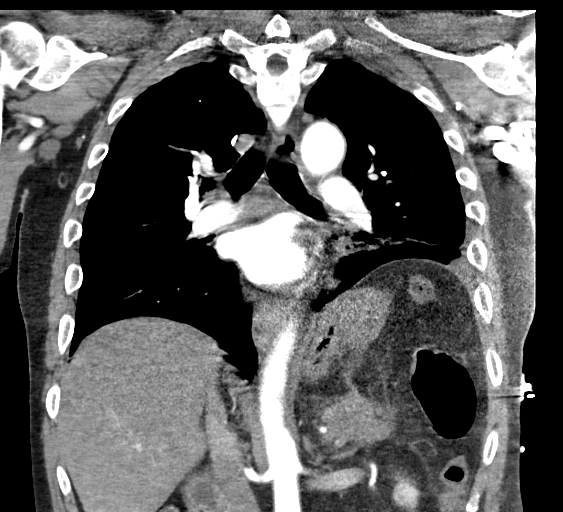

[17 of 36 positions shown; findings below may reference images not displayed]

FINDINGS: CTA CHEST FINDINGS

Cardiovascular: Exam detail diminished due to respiratory motion
artifact. No convincing evidence for acute pulmonary embolus. There
is a focal area of low-attenuation attenuation within a segmental
branch to the lateral right lower lobe, image 211/3. This is favored
to represent artifact secondary to motion artifact as well as sub
optimal pulmonary arterial opacification.

Normal heart size. No pericardial effusion. Coronary artery
calcifications.

Mediastinum/Nodes: Normal appearance of the thyroid gland. The
trachea appears patent and is midline. Normal appearance of the
esophagus. No supraclavicular, axillary, mediastinal, or hilar
adenopathy.

Lungs/Pleura: Trace left pleural effusion. Airspace consolidation
involving greater than 50% of the left lower lobe with air
bronchograms noted. Small nodule within the periphery of the left
upper lobe measures 3 mm. New from the previous exam.

Musculoskeletal: No chest wall abnormality. No acute or significant
osseous findings.

Review of the MIP images confirms the above findings.

CT ABDOMEN and PELVIS FINDINGS

Hepatobiliary: No focal liver abnormality. No gallbladder
unremarkable. No signs of gallbladder wall inflammation or bile duct
dilatation.

Pancreas: There is diffuse pancreatic edema along with
peripancreatic fat stranding and a small amount of free fluid.
Imaging findings are compatible with acute pancreatitis. No signs of
pancreatic necrosis or mature pseudocyst formation.

Spleen: Normal in size without focal abnormality.

Adrenals/Urinary Tract: Normal appearance of the adrenal glands. No
kidney mass or hydronephrosis. Urinary bladder is unremarkable.

Stomach/Bowel: Stomach appears normal. The appendix is difficult to
visualize separate from the right lower quadrant bowel loops. No
secondary signs of acute appendicitis. No bowel wall thickening,
inflammation, or distension.

Vascular/Lymphatic: Aortic atherosclerosis. No aneurysm. No
abdominopelvic adenopathy.

Reproductive: Prostate is unremarkable.

Other: No discrete fluid collections identified. No ventral
abdominal wall hernia.

Musculoskeletal: No acute or significant osseous findings.
Degenerative disc disease is identified at L5-S1.

Review of the MIP images confirms the above findings.
IMPRESSION: 1. Exam detail is diminished due to respiratory motion artifact. No
convincing evidence for acute pulmonary embolus.
2. Left lower lobe airspace consolidation compatible with pneumonia
and/or aspiration. Advise follow-up imaging to ensure resolution.
3. Acute pancreatitis. No signs of pancreatic necrosis or mature
pseudocyst formation.
4. Aortic atherosclerosis. Coronary artery calcifications.
5. Small nodule within the periphery of the left upper lobe measures
3 mm. No follow-up needed if patient is low-risk. Non-contrast chest
CT can be considered in 12 months if patient is high-risk. This
recommendation follows the consensus statement: Guidelines for
Management of Incidental Pulmonary Nodules Detected on CT Images:

Aortic Atherosclerosis (JQQ4Z-PTE.E).

## 2023-01-24 IMAGING — CT CT HEAD W/O CM
4 series · 17 of 47 positions shown, 19 images · non-contrast
Comparison: None.

CLINICAL DATA: Delirium. Sudden onset tachycardia and hypertension.

EXAM:
CT HEAD WITHOUT CONTRAST
TECHNIQUE: Contiguous axial images were obtained from the base of the skull
through the vertex without intravenous contrast.

[Series 2: head w o · axial · 0.43mm/px · z∈[-130,-10]mm · 7 of 34 slices shown, 9 images]
[im 5/34  brain]
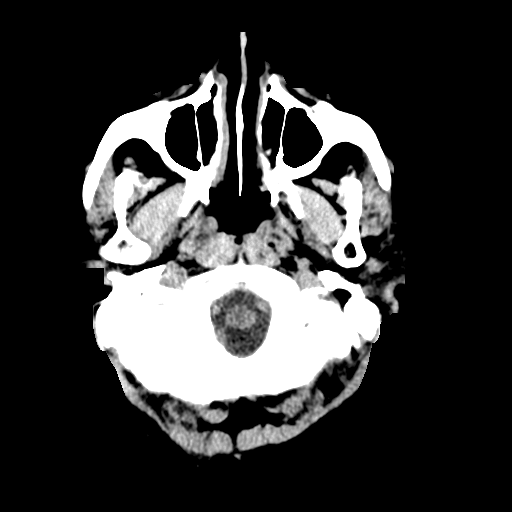
[im 5/34  bone]
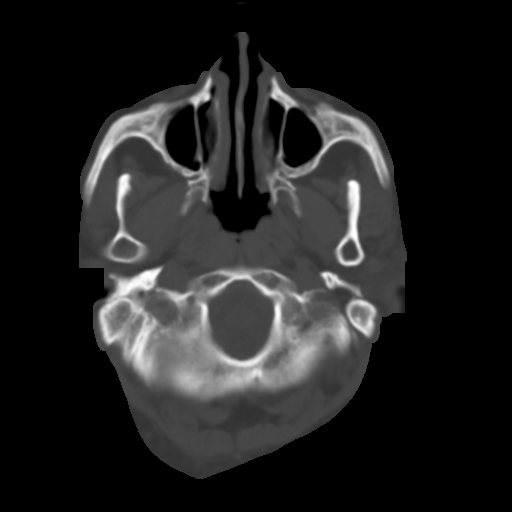
[im 9/34  brain]
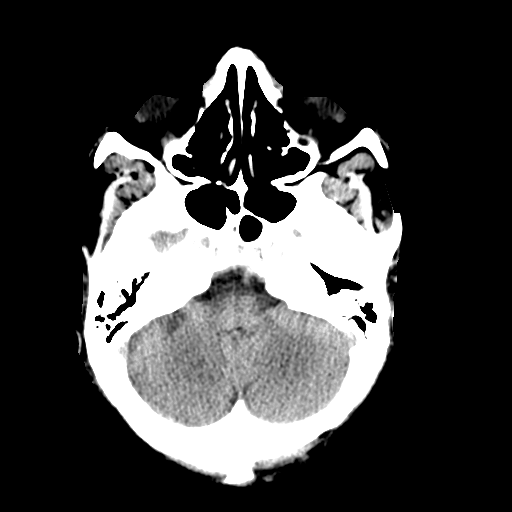
[im 13/34  brain]
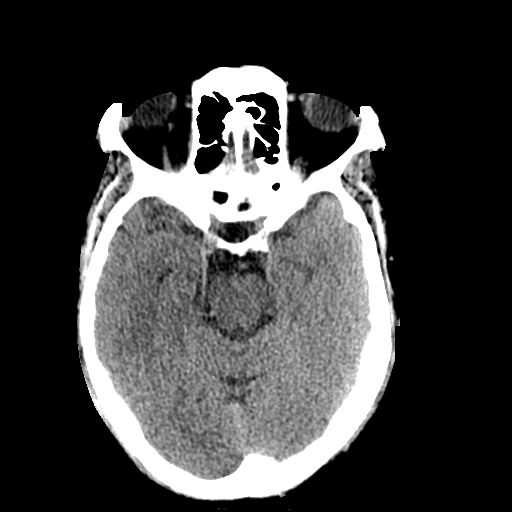
[im 17/34  brain]
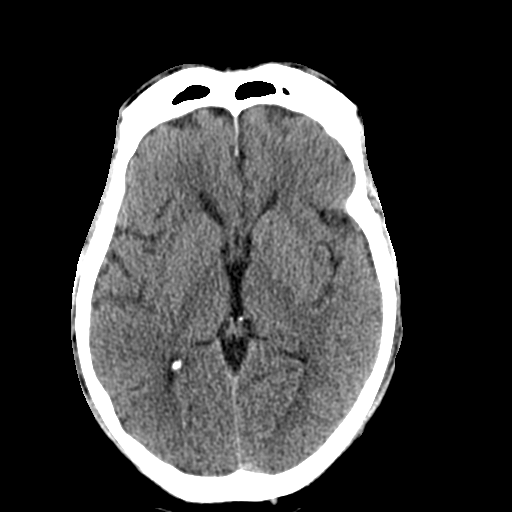
[im 21/34  brain]
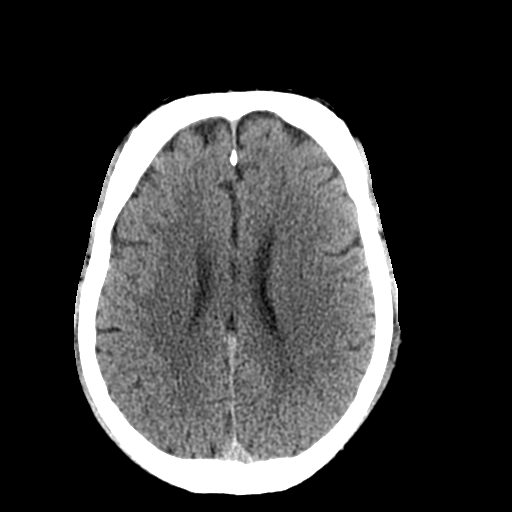
[im 21/34  bone]
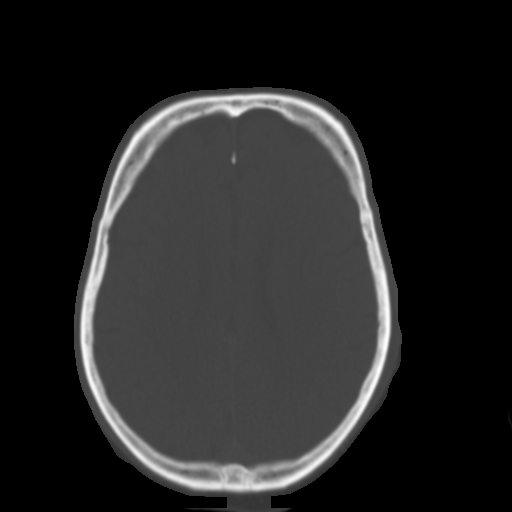
[im 25/34  brain]
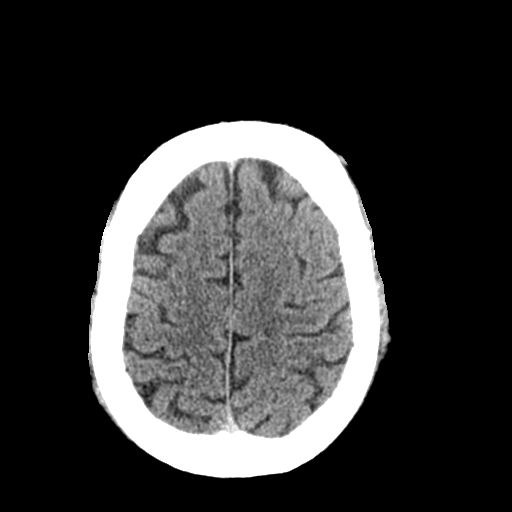
[im 29/34  brain]
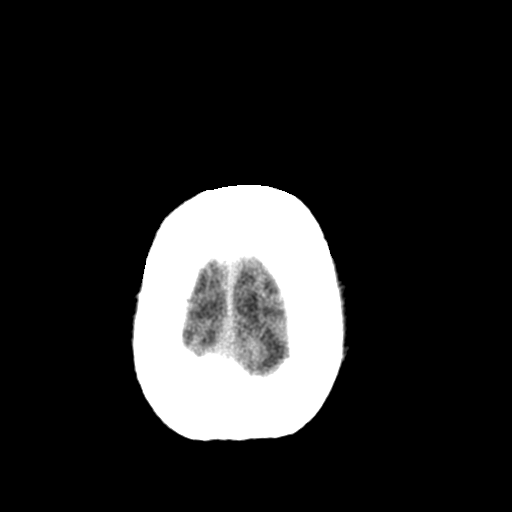

[Series 3: head bone · axial · 0.43mm/px · z∈[-134,-76]mm · 4 of 84 slices shown]
[im 9/84  bone]
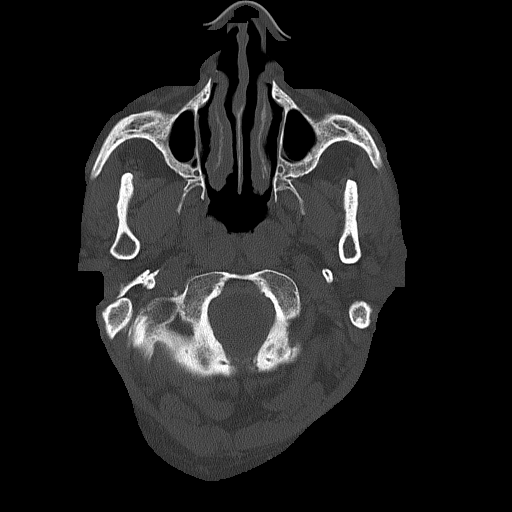
[im 17/84  bone]
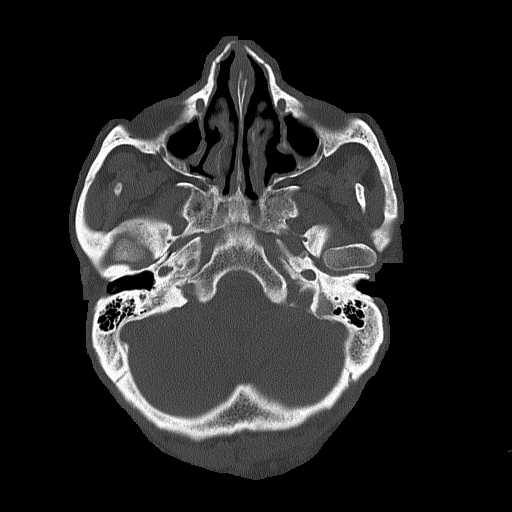
[im 25/84  bone]
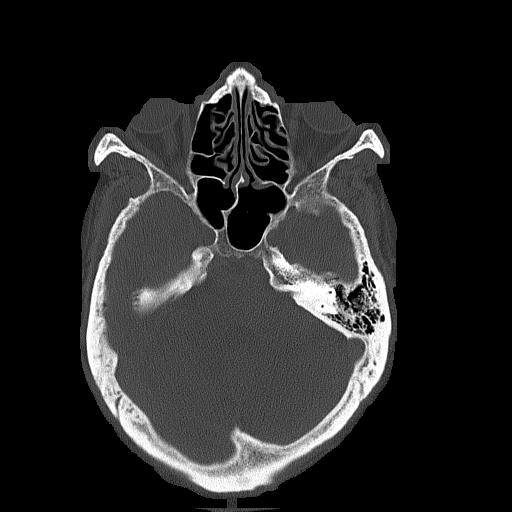
[im 38/84  bone]
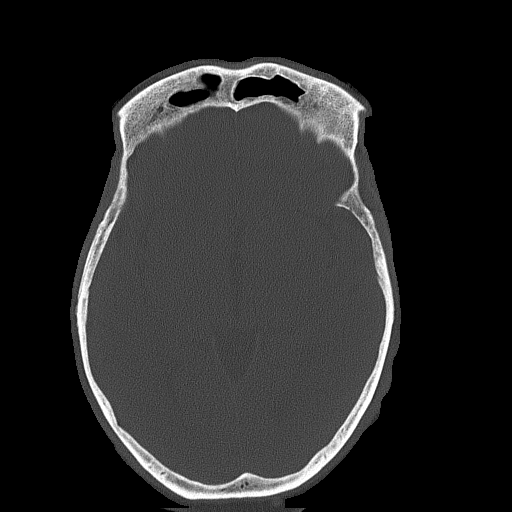

[Series 4: coronal soft · coronal · 0.32mm/px · 3 of 72 slices shown]
[im 24/72  brain]
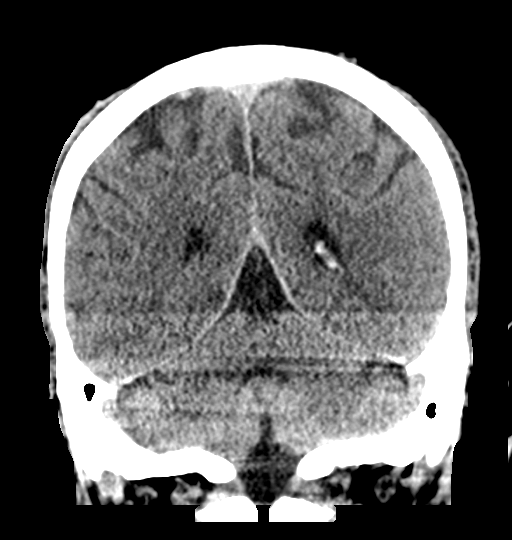
[im 32/72  brain]
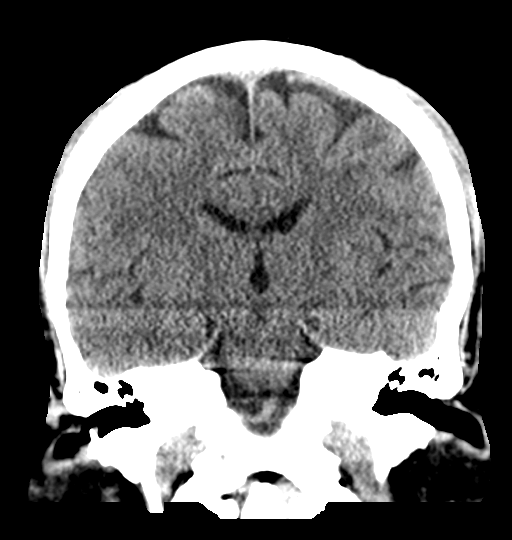
[im 40/72  brain]
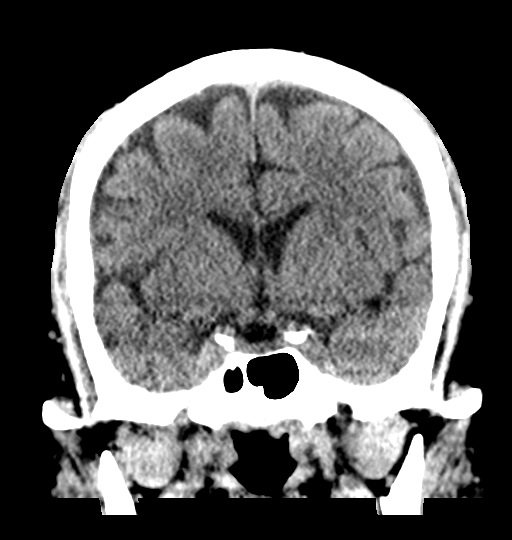

[Series 5: sagittal soft · sagittal · 0.36mm/px · 3 of 57 slices shown]
[im 19/57  brain]
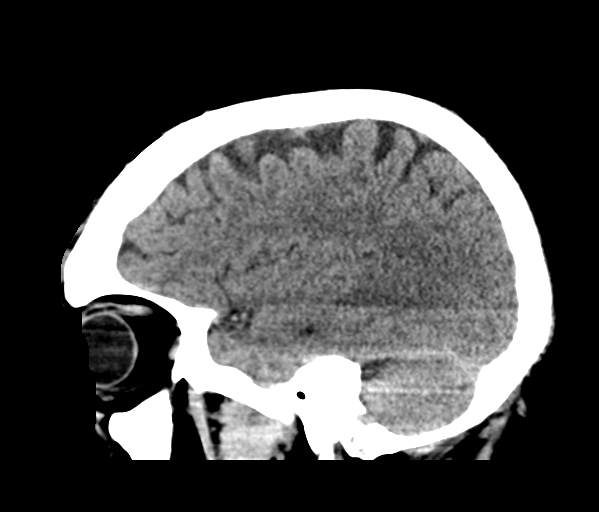
[im 29/57  brain]
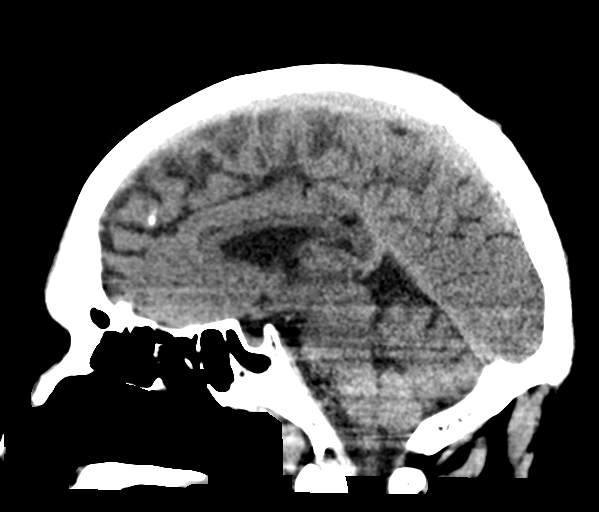
[im 38/57  brain]
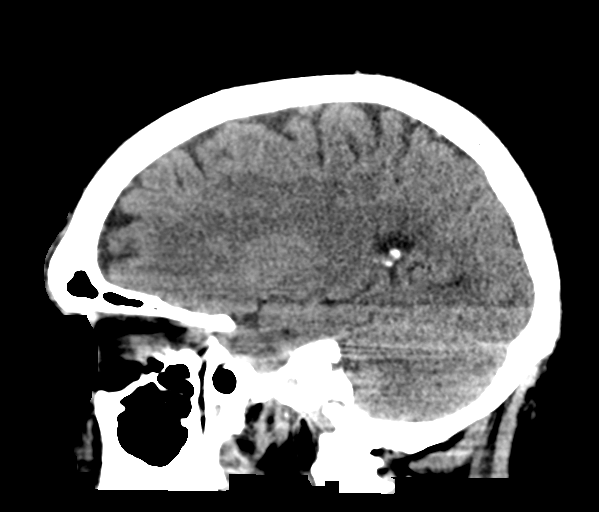

[17 of 47 positions shown; findings below may reference images not displayed]

FINDINGS: Brain:

No evidence of large-territorial acute infarction. No parenchymal
hemorrhage. No mass lesion. No extra-axial collection.

No mass effect or midline shift. No hydrocephalus. Basilar cisterns
are patent.

Vascular: No hyperdense vessel.

Skull: No acute fracture or focal lesion.

Sinuses/Orbits: Paranasal sinuses and mastoid air cells are clear.
The orbits are unremarkable.

Other: Periapical lucencies surrounding a left maxillary teeth.
IMPRESSION: 1. No acute intracranial abnormality.
2. Periapical lucencies surrounding a left maxillary teeth.
Correlate with physical exam for infection.

## 2023-01-24 IMAGING — DX DG CHEST 1V PORT
1 series · 1 of 1 positions shown · non-contrast
Comparison: CT chest 02/18/2018

CLINICAL DATA: Altered mental status.  Headache seizure.

EXAM:
PORTABLE CHEST 1 VIEW

[chest ap]
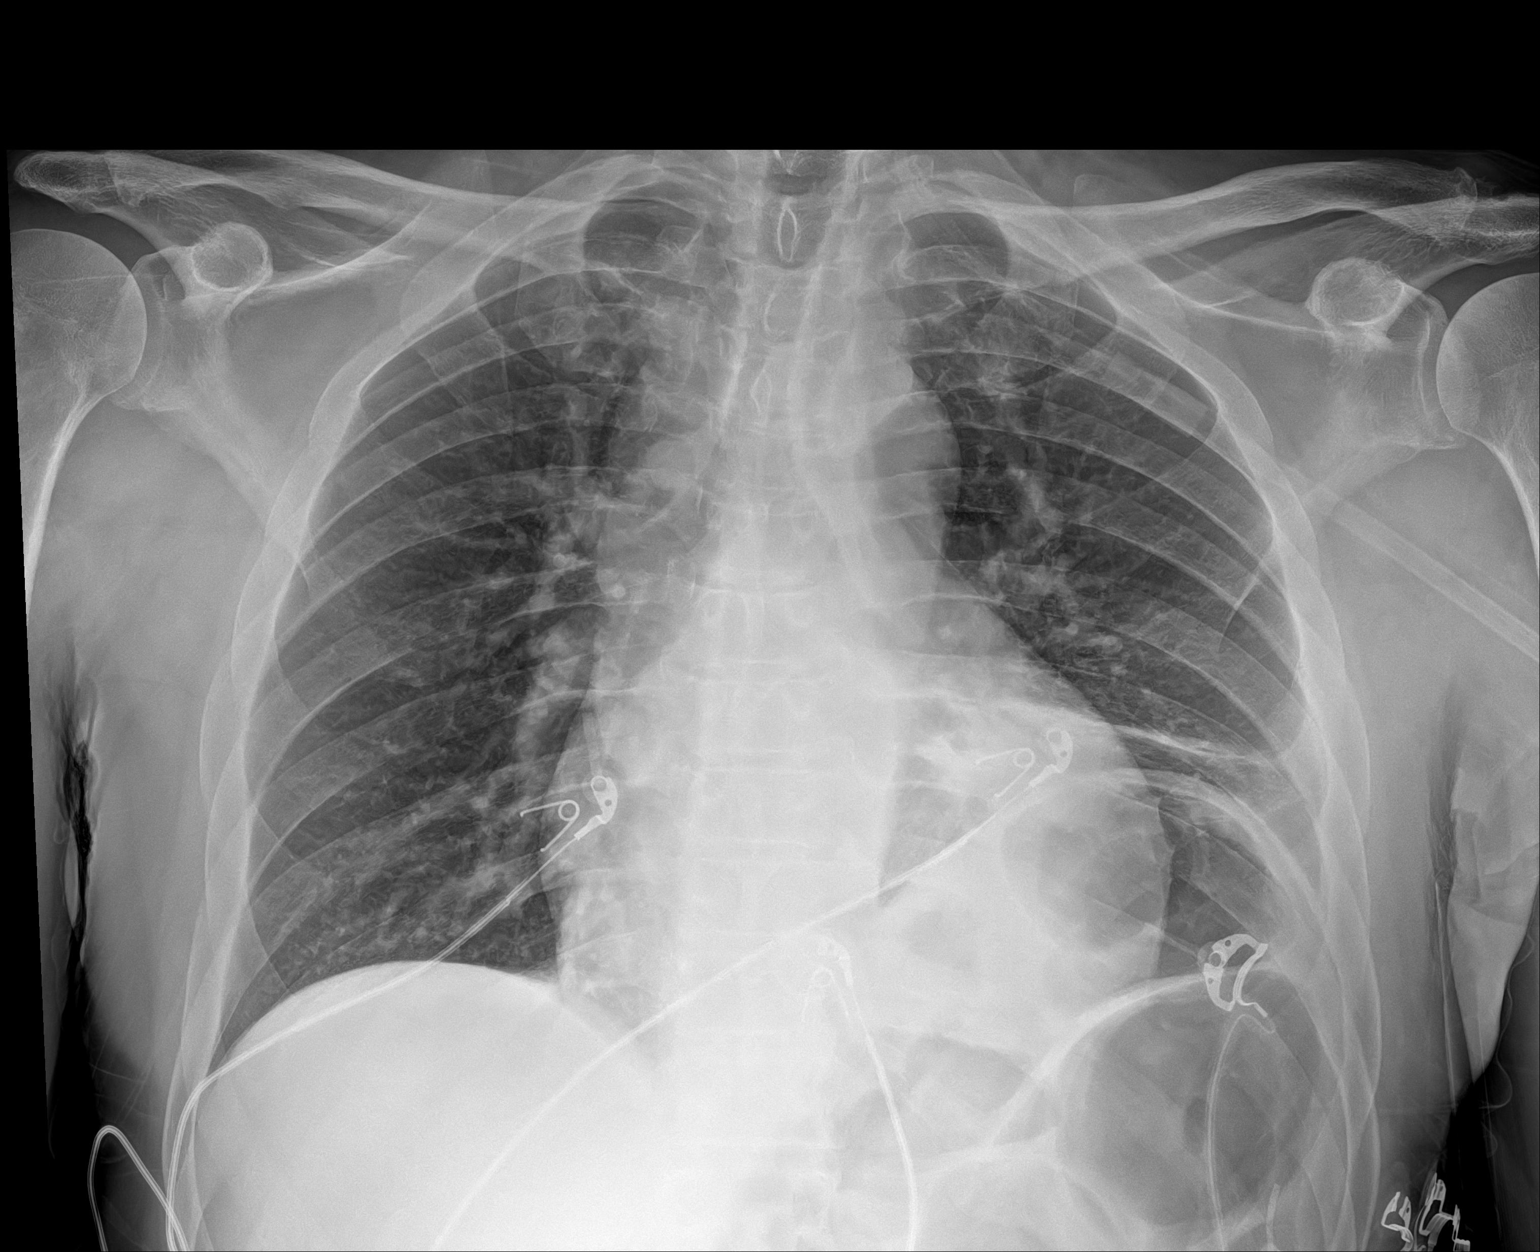

[1 of 1 positions shown; findings below may reference images not displayed]

FINDINGS: The heart size and mediastinal contours are within normal limits.

Elevated left hemidiaphragm with linear atelectasis. Question
retrocardiac opacity which could be related to previously identified
nodule on CT chest 02/18/2018. No pulmonary edema. No pleural
effusion. No pneumothorax.

No acute osseous abnormality.

Gaseous distension of the visualized bowel within the left upper
abdomen.
IMPRESSION: 1. Elevated left hemidiaphragm with linear atelectasis. Question
retrocardiac opacity which could be related to previously identified
nodule on CT chest 02/18/2018.
2. Gaseous distension of the visualized bowel within the left upper
abdomen.

## 2023-01-24 IMAGING — CT CT ABD-PELV W/ CM
2 of 5 series · 14 of 46 positions shown, 16 images · IV contrast (Omnipaque or Isovue)
Comparison: CT chest from 02/18/2018

CLINICAL DATA: status post fall 2 days ago. Abdominal pain. Chest
pain and shortness of breath. Elevated blood pressure. Rule out
acute pulmonary embolus.

EXAM:
CT ANGIOGRAPHY CHEST
CT ABDOMEN AND PELVIS WITH CONTRAST
TECHNIQUE: Multidetector CT imaging of the chest was performed using the
standard protocol during bolus administration of intravenous
contrast. Multiplanar CT image reconstructions and MIPs were
obtained to evaluate the vascular anatomy. Multidetector CT imaging
of the abdomen and pelvis was performed using the standard protocol
during bolus administration of intravenous contrast.
CONTRAST:  100mL OMNIPAQUE IOHEXOL 350 MG/ML SOLN

[Series 4: abd/pel w/ · axial · 0.76mm/px · z∈[+641,+1076]mm · 11 of 103 slices shown, 13 images]
[im 8/103  soft-tissue]
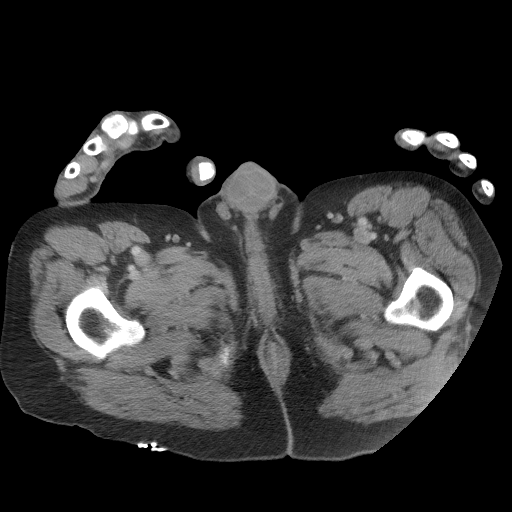
[im 8/103  bone]
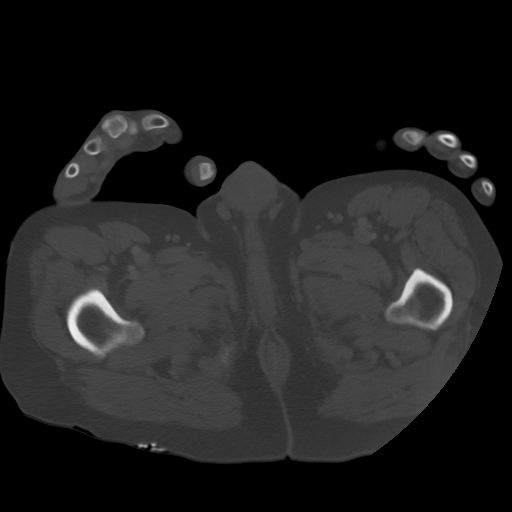
[im 15/103  soft-tissue]
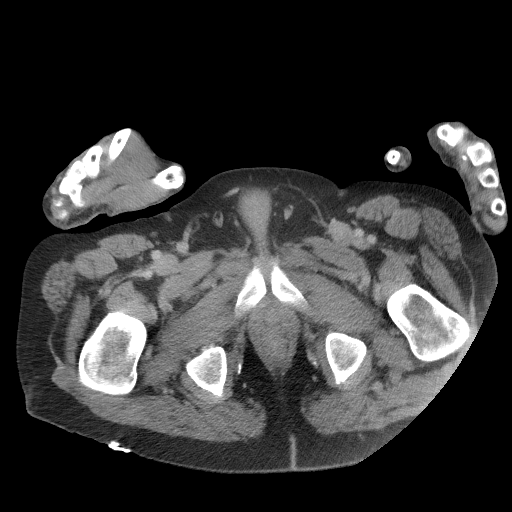
[im 22/103  soft-tissue]
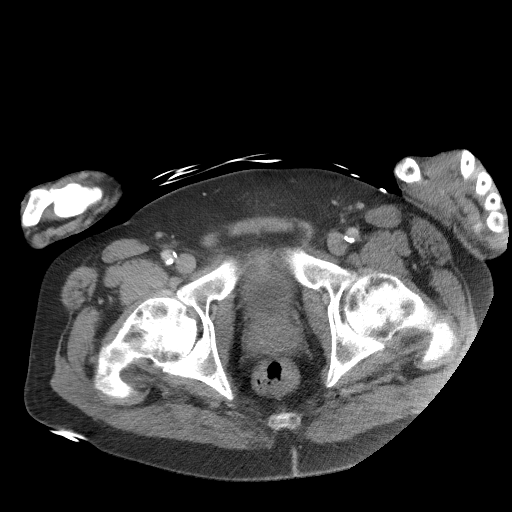
[im 37/103  soft-tissue]
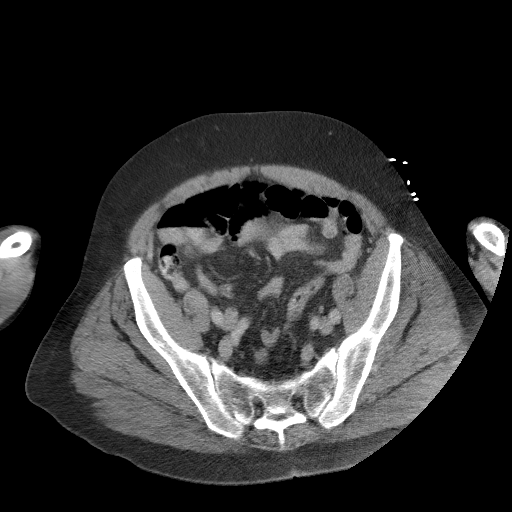
[im 44/103  soft-tissue]
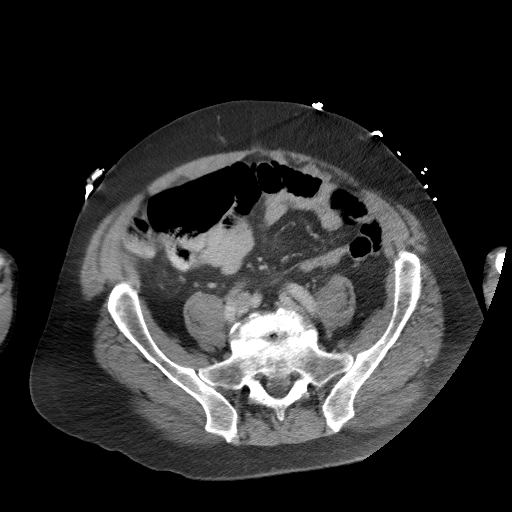
[im 52/103  soft-tissue]
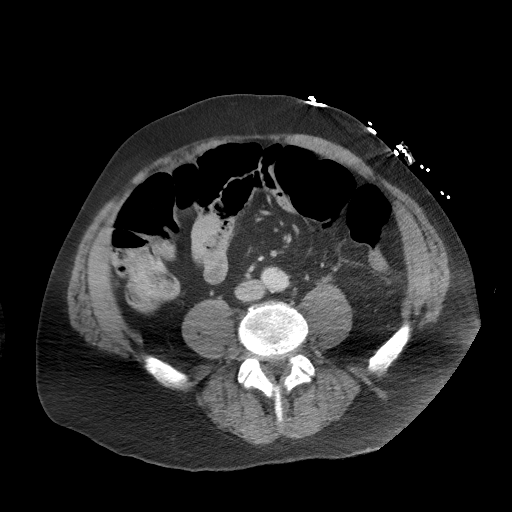
[im 59/103  soft-tissue]
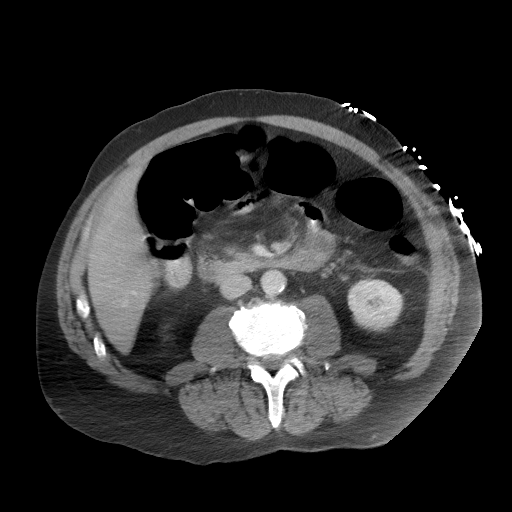
[im 66/103  soft-tissue]
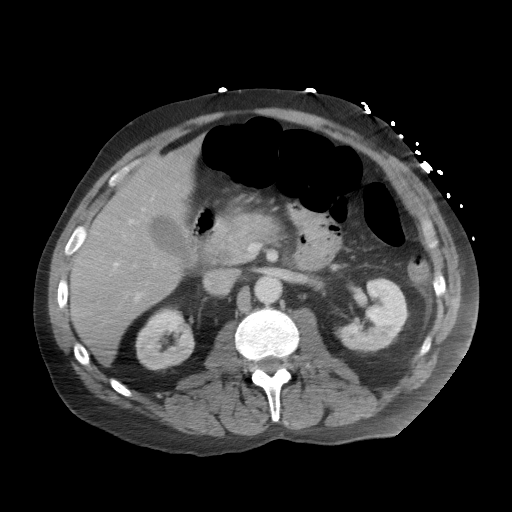
[im 81/103  soft-tissue]
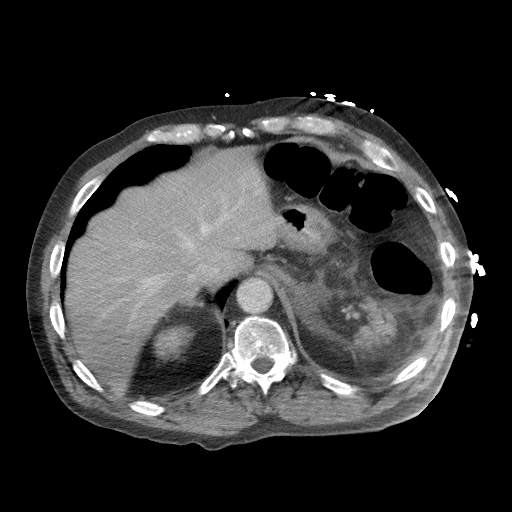
[im 81/103  bone]
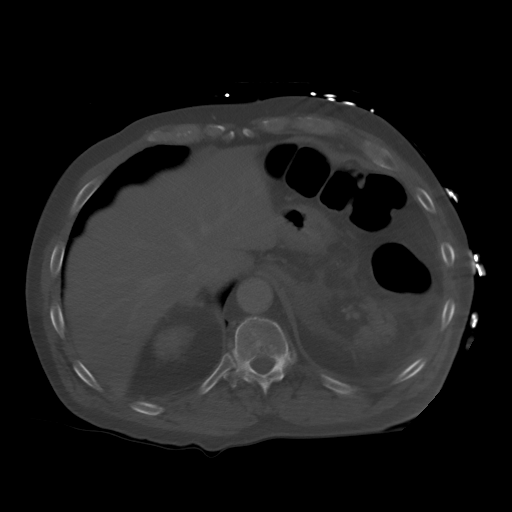
[im 88/103  soft-tissue]
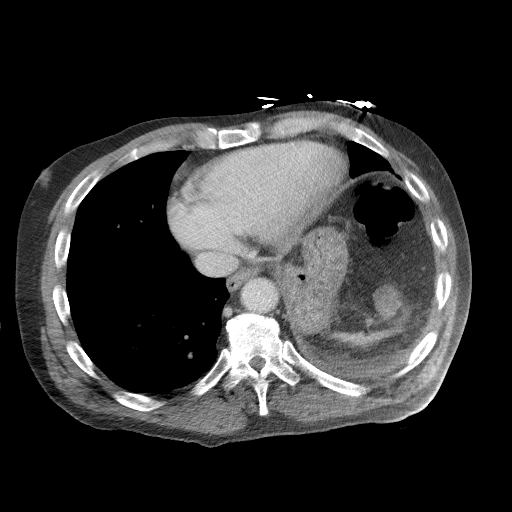
[im 95/103  soft-tissue]
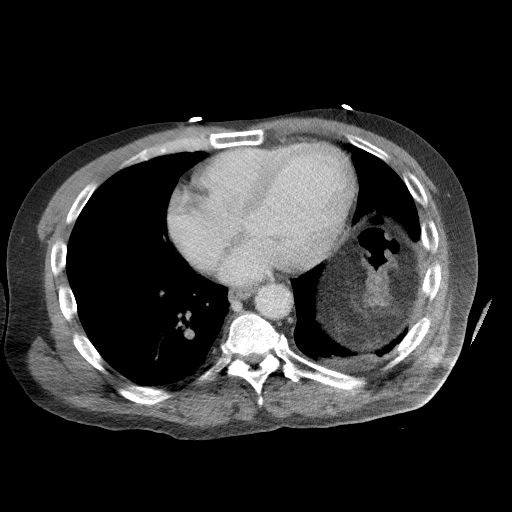

[Series 6: coronal · coronal · 0.84mm/px · 3 of 106 slices shown]
[im 36/106  soft-tissue]
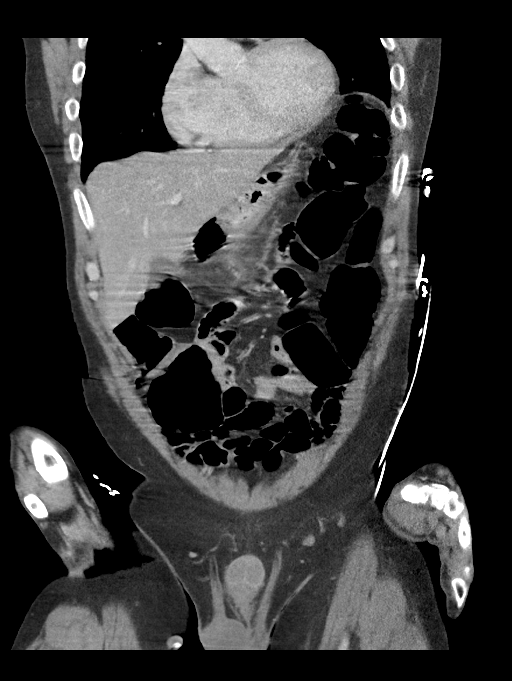
[im 47/106  soft-tissue]
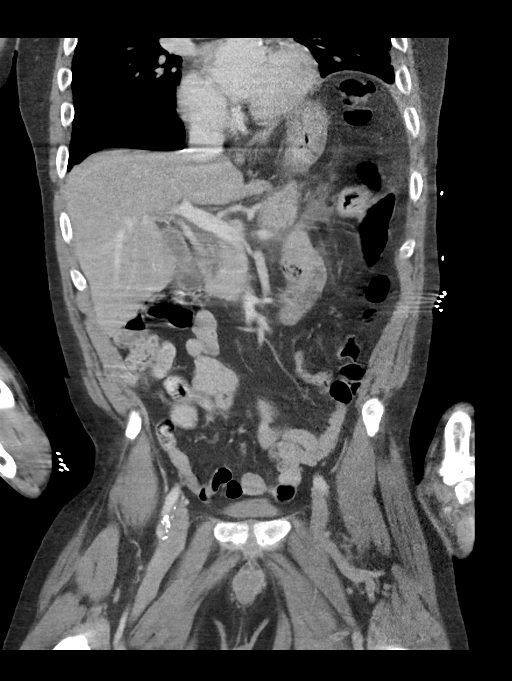
[im 59/106  soft-tissue]
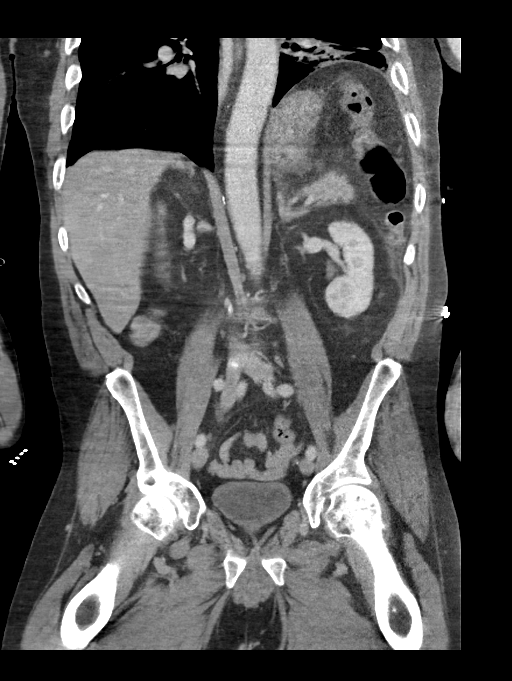

[14 of 46 positions shown; findings below may reference images not displayed]

FINDINGS: CTA CHEST FINDINGS

Cardiovascular: Exam detail diminished due to respiratory motion
artifact. No convincing evidence for acute pulmonary embolus. There
is a focal area of low-attenuation attenuation within a segmental
branch to the lateral right lower lobe, image 211/3. This is favored
to represent artifact secondary to motion artifact as well as sub
optimal pulmonary arterial opacification.

Normal heart size. No pericardial effusion. Coronary artery
calcifications.

Mediastinum/Nodes: Normal appearance of the thyroid gland. The
trachea appears patent and is midline. Normal appearance of the
esophagus. No supraclavicular, axillary, mediastinal, or hilar
adenopathy.

Lungs/Pleura: Trace left pleural effusion. Airspace consolidation
involving greater than 50% of the left lower lobe with air
bronchograms noted. Small nodule within the periphery of the left
upper lobe measures 3 mm. New from the previous exam.

Musculoskeletal: No chest wall abnormality. No acute or significant
osseous findings.

Review of the MIP images confirms the above findings.

CT ABDOMEN and PELVIS FINDINGS

Hepatobiliary: No focal liver abnormality. No gallbladder
unremarkable. No signs of gallbladder wall inflammation or bile duct
dilatation.

Pancreas: There is diffuse pancreatic edema along with
peripancreatic fat stranding and a small amount of free fluid.
Imaging findings are compatible with acute pancreatitis. No signs of
pancreatic necrosis or mature pseudocyst formation.

Spleen: Normal in size without focal abnormality.

Adrenals/Urinary Tract: Normal appearance of the adrenal glands. No
kidney mass or hydronephrosis. Urinary bladder is unremarkable.

Stomach/Bowel: Stomach appears normal. The appendix is difficult to
visualize separate from the right lower quadrant bowel loops. No
secondary signs of acute appendicitis. No bowel wall thickening,
inflammation, or distension.

Vascular/Lymphatic: Aortic atherosclerosis. No aneurysm. No
abdominopelvic adenopathy.

Reproductive: Prostate is unremarkable.

Other: No discrete fluid collections identified. No ventral
abdominal wall hernia.

Musculoskeletal: No acute or significant osseous findings.
Degenerative disc disease is identified at L5-S1.

Review of the MIP images confirms the above findings.
IMPRESSION: 1. Exam detail is diminished due to respiratory motion artifact. No
convincing evidence for acute pulmonary embolus.
2. Left lower lobe airspace consolidation compatible with pneumonia
and/or aspiration. Advise follow-up imaging to ensure resolution.
3. Acute pancreatitis. No signs of pancreatic necrosis or mature
pseudocyst formation.
4. Aortic atherosclerosis. Coronary artery calcifications.
5. Small nodule within the periphery of the left upper lobe measures
3 mm. No follow-up needed if patient is low-risk. Non-contrast chest
CT can be considered in 12 months if patient is high-risk. This
recommendation follows the consensus statement: Guidelines for
Management of Incidental Pulmonary Nodules Detected on CT Images:

Aortic Atherosclerosis (JQQ4Z-PTE.E).

## 2023-08-07 IMAGING — CR DG ABDOMEN 2V
1 series · 3 of 3 positions shown · non-contrast
Comparison: CT 01/28/2021

CLINICAL DATA: Abdomen pain

EXAM:
ABDOMEN - 2 VIEW

[Series 1: dg abd 2 views · 0.14mm/px · 3 of 3 slices shown]
[im 1/3]
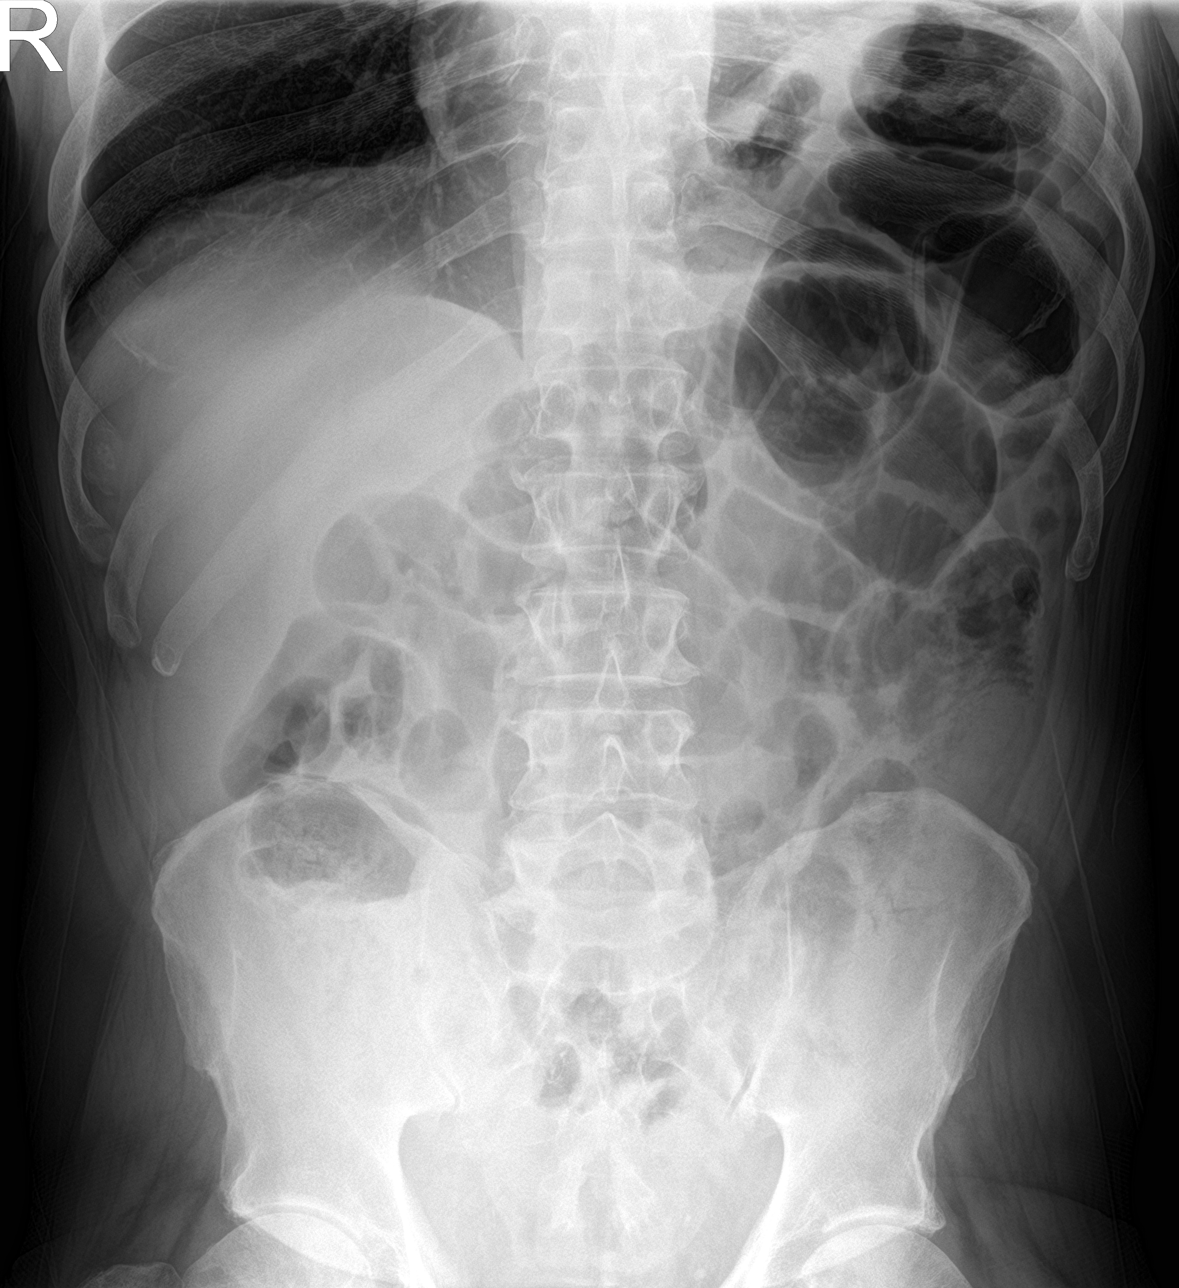
[im 2/3]
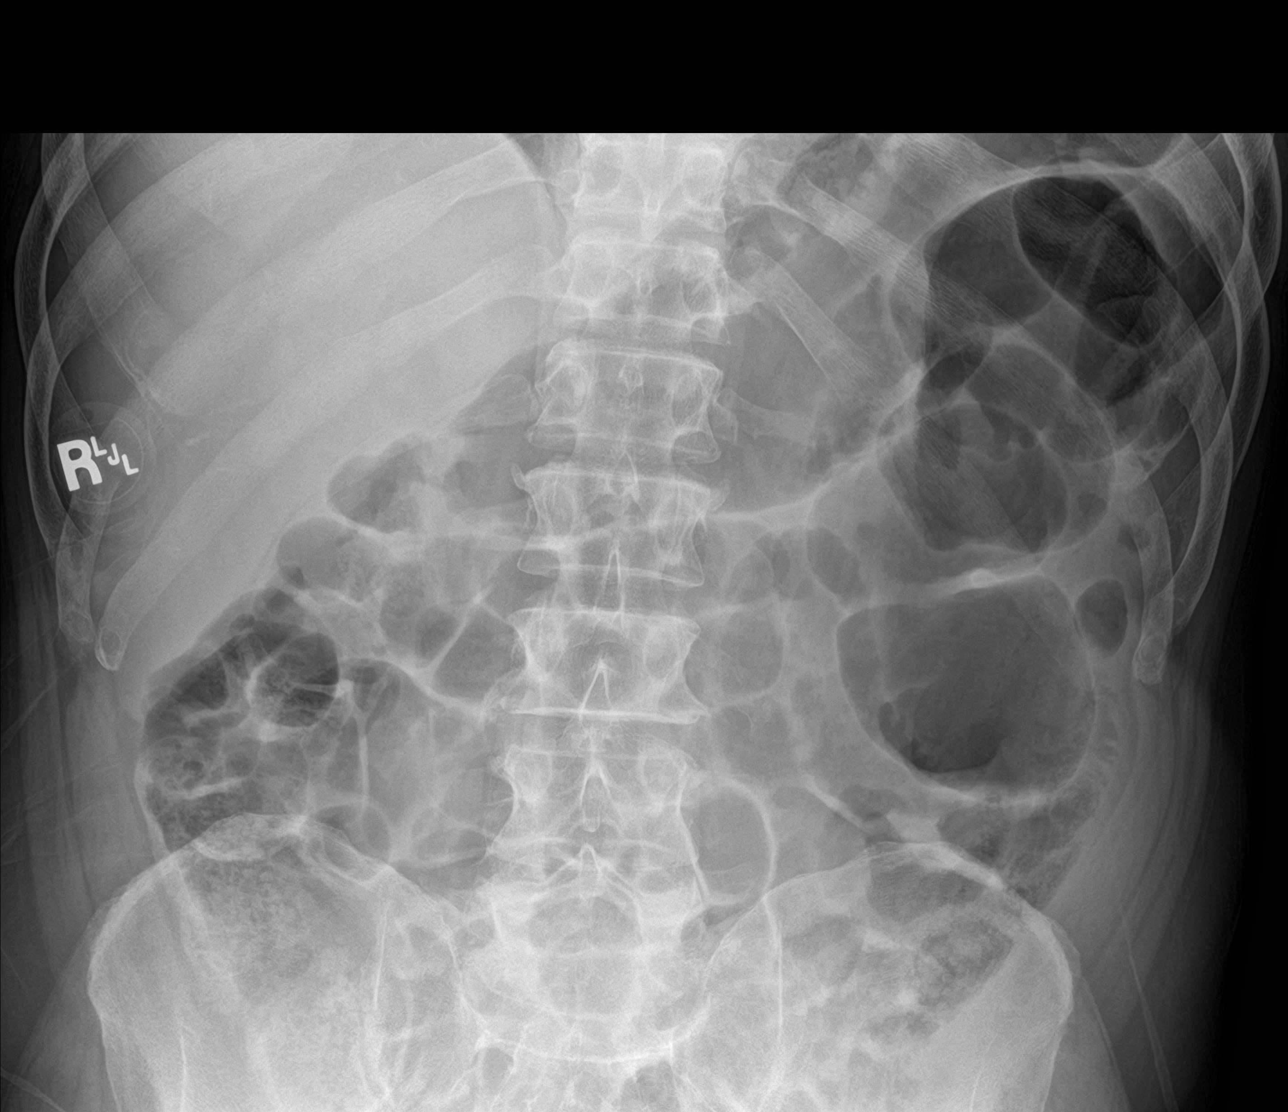
[im 3/3]
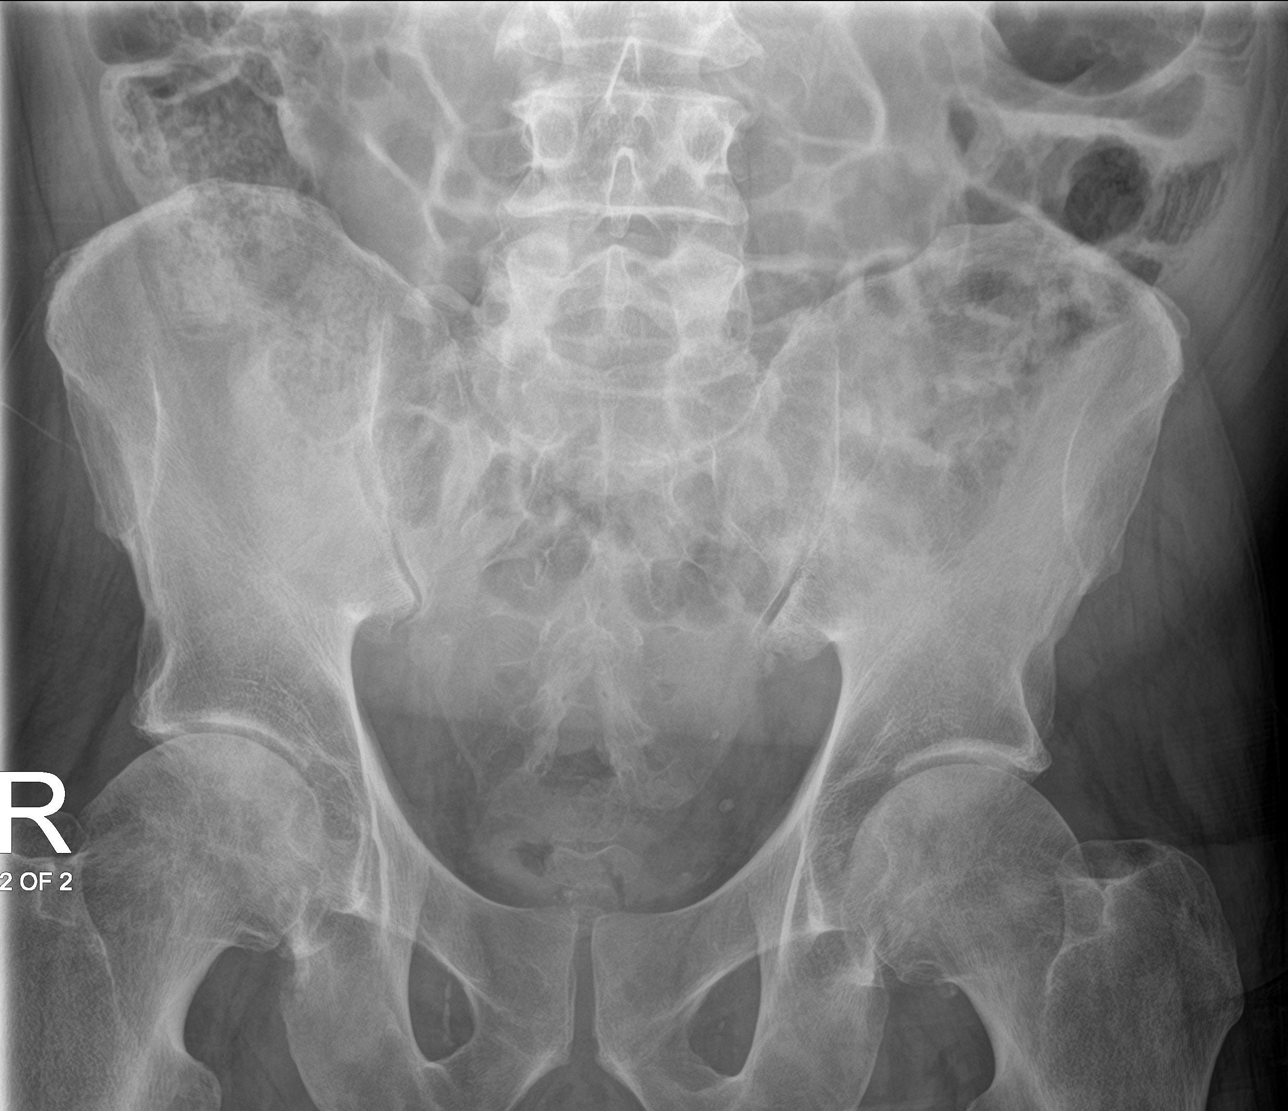

[3 of 3 positions shown; findings below may reference images not displayed]

FINDINGS: No free air beneath the diaphragm. Mild to moderate diffuse gaseous
dilatation of small and large bowel with scattered rectal gas.
Phleboliths in the pelvis.
IMPRESSION: Mild to moderate diffuse gaseous dilatation of the bowel suggestive
of ileus or enteritis.

## 2023-08-07 IMAGING — CT CT ABD-PELV W/O CM
2 of 4 series · 16 of 46 positions shown, 18 images · non-contrast
Comparison: Radiograph 08/11/2021, CT 01/28/2021, chest CT
02/18/2018

CLINICAL DATA: Distension all pain no bowel movement

EXAM:
CT ABDOMEN AND PELVIS WITHOUT CONTRAST
TECHNIQUE: Multidetector CT imaging of the abdomen and pelvis was performed
following the standard protocol without IV contrast.

[Series 2: routine abd/pel wo · axial · 0.85mm/px · z∈[-298,+157]mm · 13 of 101 slices shown, 15 images]
[im 5/101  soft-tissue]
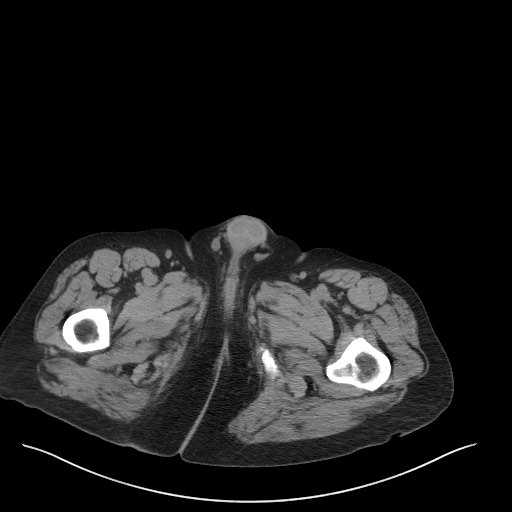
[im 5/101  bone]
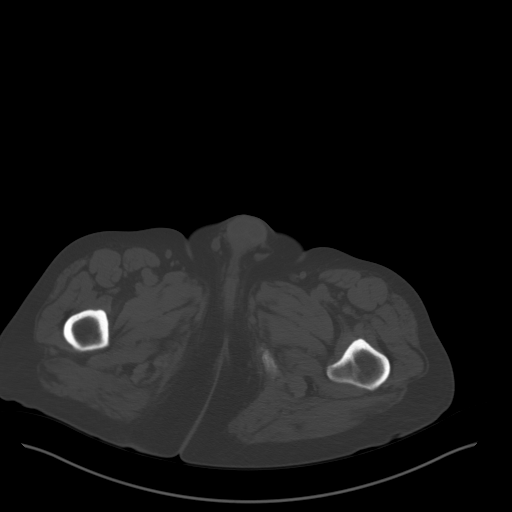
[im 14/101  soft-tissue]
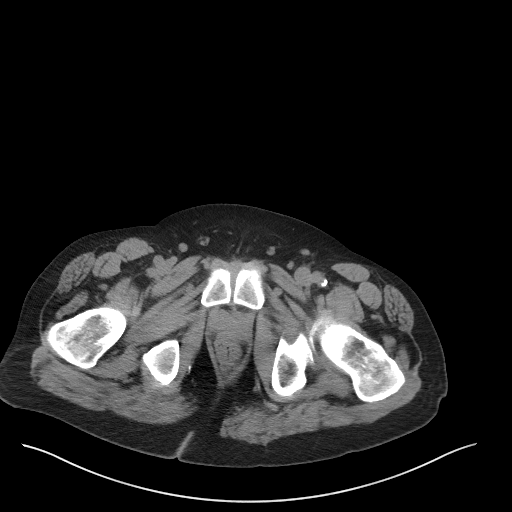
[im 22/101  soft-tissue]
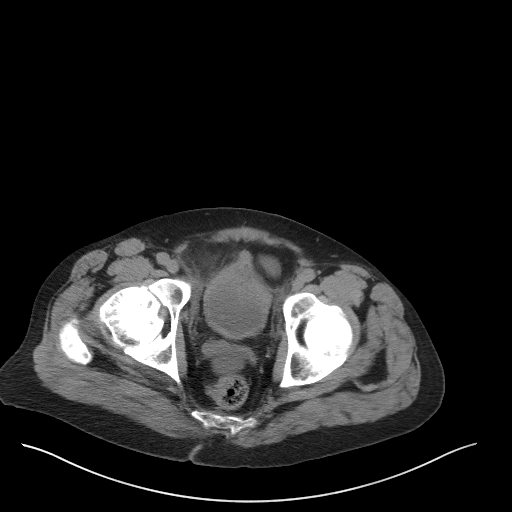
[im 27/101  soft-tissue]
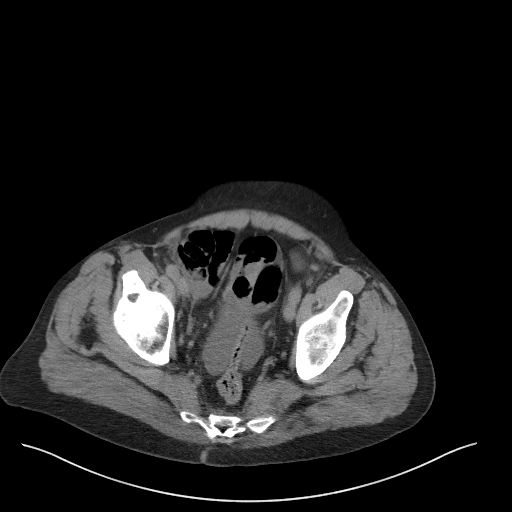
[im 35/101  soft-tissue]
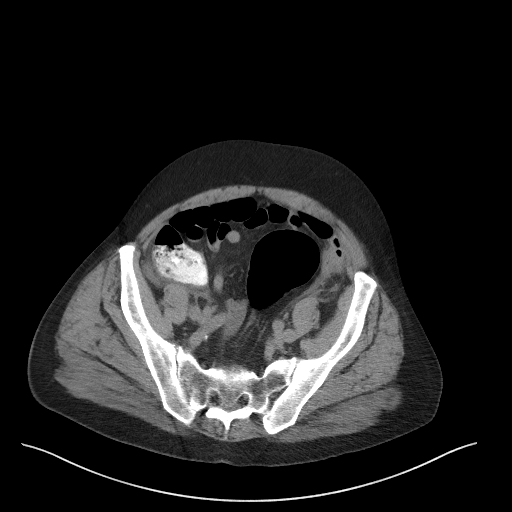
[im 44/101  soft-tissue]
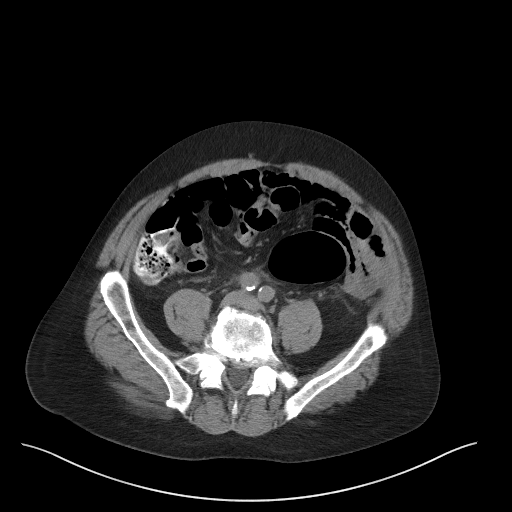
[im 53/101  soft-tissue]
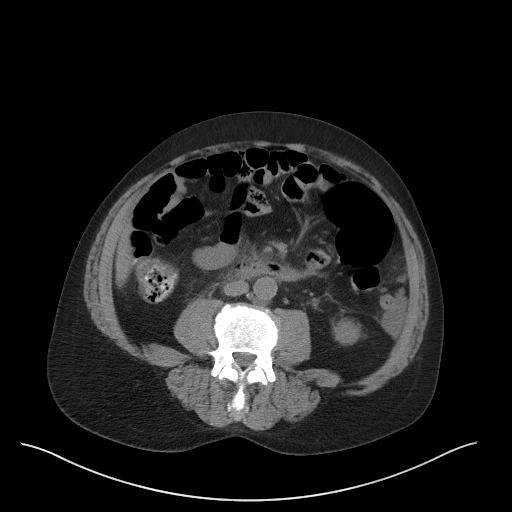
[im 57/101  soft-tissue]
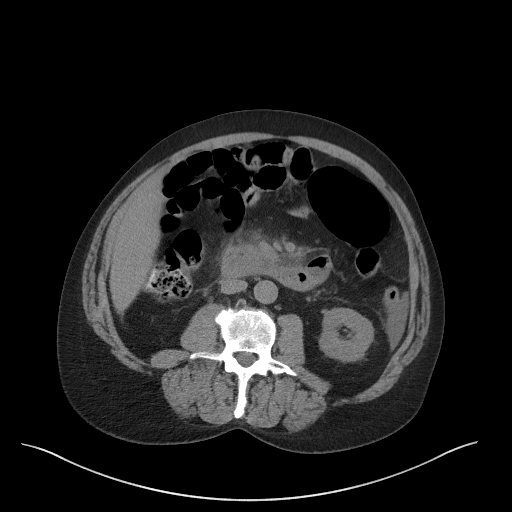
[im 66/101  soft-tissue]
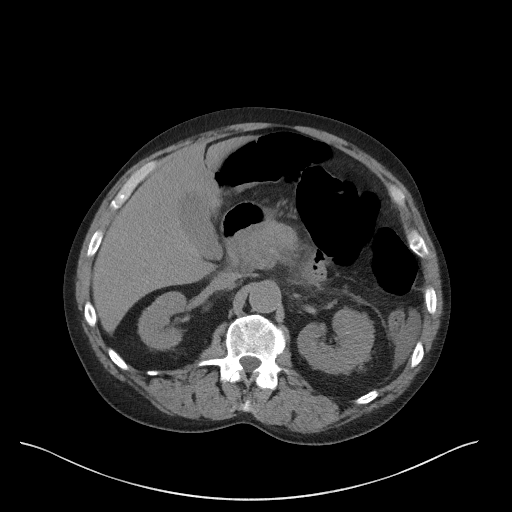
[im 66/101  bone]
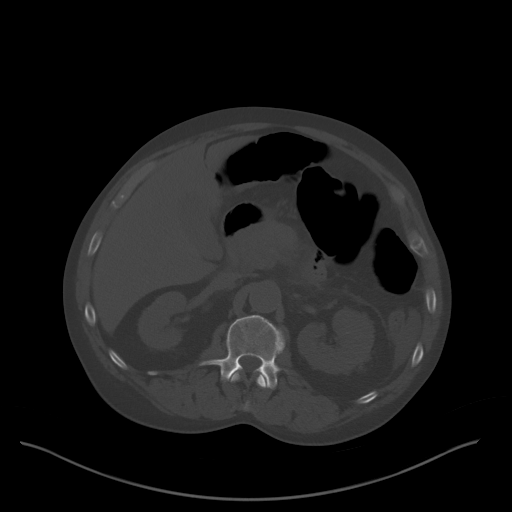
[im 74/101  soft-tissue]
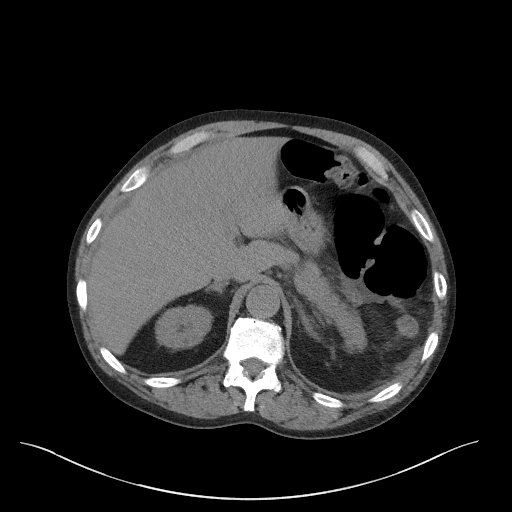
[im 79/101  soft-tissue]
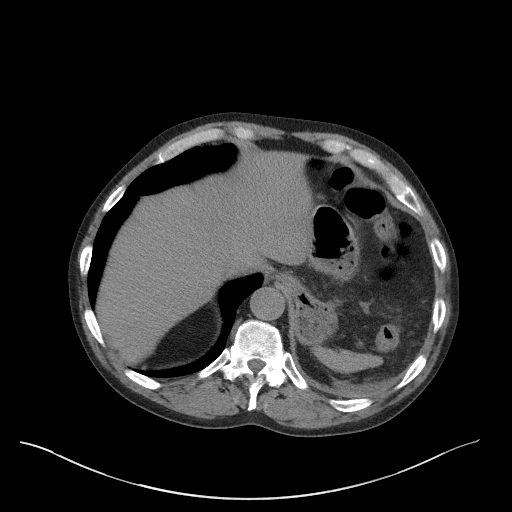
[im 87/101  soft-tissue]
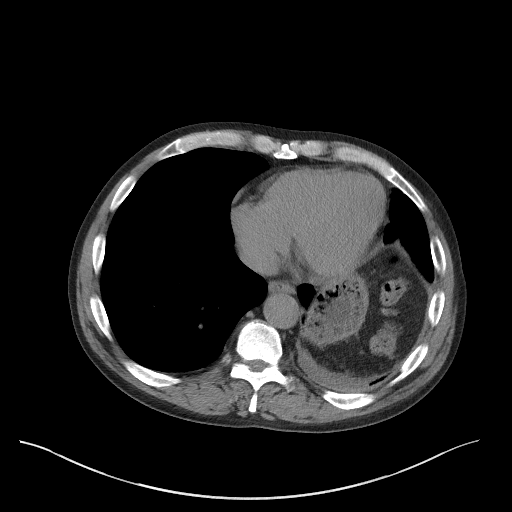
[im 96/101  soft-tissue]
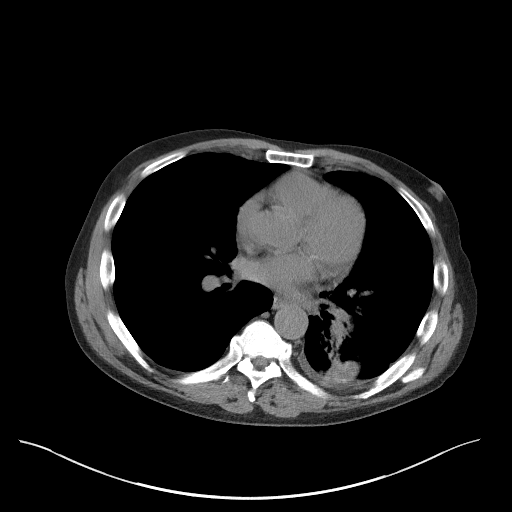

[Series 5: coronal st · coronal · 0.78mm/px · 3 of 96 slices shown]
[im 32/96  soft-tissue]
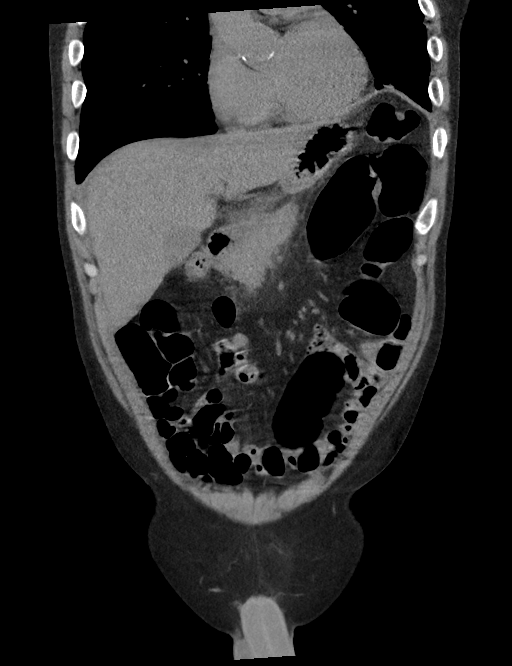
[im 43/96  soft-tissue]
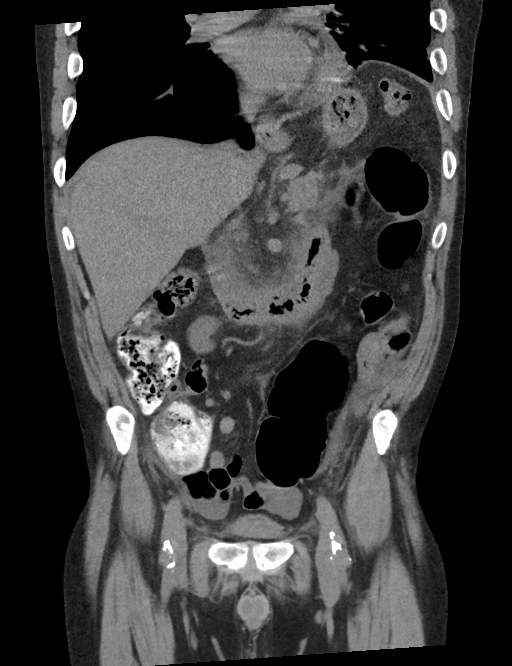
[im 53/96  soft-tissue]
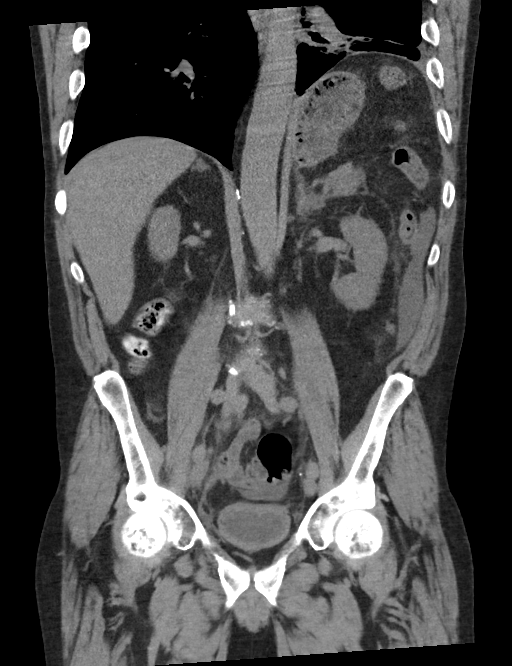

[16 of 46 positions shown; findings below may reference images not displayed]

FINDINGS: Lower chest: Lung bases demonstrate small left-sided pleural
effusion. Persistent area of airspace consolidation within the left
lower lobe with areas of central low attenuation suggestive of
necrosis. There is mild left lower lobe bronchiectasis.

Hepatobiliary: No focal liver abnormality is seen. No gallstones,
gallbladder wall thickening, or biliary dilatation.

Pancreas: Moderate peripancreatic stranding and fluid consistent
with acute pancreatitis.

Spleen: Atrophic in appearance.

Adrenals/Urinary Tract: Adrenal glands are normal. Kidneys show no
hydronephrosis. The bladder is slightly thick walled

Stomach/Bowel: The stomach is nonenlarged. No dilated small bowel.
No acute bowel wall thickening. Negative appendix.

Vascular/Lymphatic: Mild aortic atherosclerosis. No aneurysm. No
suspicious nodes.

Reproductive: Prostate is unremarkable.

Other: Negative for pelvic effusion or free air.

Musculoskeletal: Degenerative changes. No acute osseous abnormality.
IMPRESSION: 1. Findings consistent with acute pancreatitis. No gross organized
fluid collections allowing for absence of contrast
2. Persistent area of consolidation in the left lower lobe with
areas of central low density suggestive of necrosis. Pulmonary
consultation is recommended as neoplasm cannot be excluded given
findings on prior CT chest imaging.

## 2023-10-21 ENCOUNTER — Other Ambulatory Visit: Payer: Self-pay

## 2023-10-21 ENCOUNTER — Emergency Department (HOSPITAL_COMMUNITY): Payer: Medicare Other

## 2023-10-21 ENCOUNTER — Encounter (HOSPITAL_COMMUNITY): Payer: Self-pay

## 2023-10-21 ENCOUNTER — Emergency Department (HOSPITAL_COMMUNITY)
Admission: EM | Admit: 2023-10-21 | Discharge: 2023-10-21 | Disposition: A | Discharge status: Home / Self Care | Payer: Medicare Other | Attending: Emergency Medicine | Admitting: Emergency Medicine

## 2023-10-21 DIAGNOSIS — N183 Chronic kidney disease, stage 3 unspecified: Secondary | ICD-10-CM | POA: Insufficient documentation

## 2023-10-21 DIAGNOSIS — Z85118 Personal history of other malignant neoplasm of bronchus and lung: Secondary | ICD-10-CM | POA: Diagnosis not present

## 2023-10-21 DIAGNOSIS — E860 Dehydration: Secondary | ICD-10-CM | POA: Insufficient documentation

## 2023-10-21 DIAGNOSIS — R519 Headache, unspecified: Secondary | ICD-10-CM | POA: Diagnosis present

## 2023-10-21 DIAGNOSIS — R531 Weakness: Secondary | ICD-10-CM | POA: Insufficient documentation

## 2023-10-21 DIAGNOSIS — Z79899 Other long term (current) drug therapy: Secondary | ICD-10-CM | POA: Diagnosis not present

## 2023-10-21 DIAGNOSIS — I129 Hypertensive chronic kidney disease with stage 1 through stage 4 chronic kidney disease, or unspecified chronic kidney disease: Secondary | ICD-10-CM | POA: Insufficient documentation

## 2023-10-21 DIAGNOSIS — N179 Acute kidney failure, unspecified: Secondary | ICD-10-CM | POA: Insufficient documentation

## 2023-10-21 DIAGNOSIS — Z20822 Contact with and (suspected) exposure to covid-19: Secondary | ICD-10-CM | POA: Insufficient documentation

## 2023-10-21 DIAGNOSIS — H538 Other visual disturbances: Secondary | ICD-10-CM | POA: Diagnosis not present

## 2023-10-21 HISTORY — DX: Gout, unspecified: M10.9

## 2023-10-21 LAB — CBC WITH DIFFERENTIAL/PLATELET
Abs Immature Granulocytes: 0.04 10*3/uL (ref 0.00–0.07)
Basophils Absolute: 0.1 10*3/uL (ref 0.0–0.1)
Basophils Relative: 1 %
Eosinophils Absolute: 0 10*3/uL (ref 0.0–0.5)
Eosinophils Relative: 1 %
HCT: 35.9 % — ABNORMAL LOW (ref 39.0–52.0)
Hemoglobin: 11.8 g/dL — ABNORMAL LOW (ref 13.0–17.0)
Immature Granulocytes: 1 %
Lymphocytes Relative: 22 %
Lymphs Abs: 1.9 10*3/uL (ref 0.7–4.0)
MCH: 33.6 pg (ref 26.0–34.0)
MCHC: 32.9 g/dL (ref 30.0–36.0)
MCV: 102.3 fL — ABNORMAL HIGH (ref 80.0–100.0)
Monocytes Absolute: 1.5 10*3/uL — ABNORMAL HIGH (ref 0.1–1.0)
Monocytes Relative: 17 %
Neutro Abs: 5.1 10*3/uL (ref 1.7–7.7)
Neutrophils Relative %: 58 %
Platelets: 285 10*3/uL (ref 150–400)
RBC: 3.51 MIL/uL — ABNORMAL LOW (ref 4.22–5.81)
RDW: 14.6 % (ref 11.5–15.5)
WBC: 8.6 10*3/uL (ref 4.0–10.5)
nRBC: 2.1 % — ABNORMAL HIGH (ref 0.0–0.2)

## 2023-10-21 LAB — BASIC METABOLIC PANEL
Anion gap: 15 (ref 5–15)
BUN: 41 mg/dL — ABNORMAL HIGH (ref 8–23)
CO2: 20 mmol/L — ABNORMAL LOW (ref 22–32)
Calcium: 9.4 mg/dL (ref 8.9–10.3)
Chloride: 103 mmol/L (ref 98–111)
Creatinine, Ser: 2.66 mg/dL — ABNORMAL HIGH (ref 0.61–1.24)
GFR, Estimated: 26 mL/min — ABNORMAL LOW (ref >60)
Glucose, Bld: 75 mg/dL (ref 70–99)
Potassium: 4.8 mmol/L (ref 3.5–5.1)
Sodium: 138 mmol/L (ref 135–145)

## 2023-10-21 LAB — RESP PANEL BY RT-PCR (RSV, FLU A&B, COVID)  RVPGX2
Influenza A by PCR: POSITIVE — AB
Influenza B by PCR: NEGATIVE
Resp Syncytial Virus by PCR: NEGATIVE
SARS Coronavirus 2 by RT PCR: NEGATIVE

## 2023-10-21 LAB — MAGNESIUM: Magnesium: 1.8 mg/dL (ref 1.7–2.4)

## 2023-10-21 LAB — VITAMIN B12: Vitamin B-12: 231 pg/mL (ref 180–914)

## 2023-10-21 MED ORDER — SODIUM CHLORIDE 0.9 % IV BOLUS
1000.0000 mL | Freq: Once | INTRAVENOUS | Status: AC
Start: 2023-10-21 — End: 2023-10-21
  Administered 2023-10-21: 1000 mL via INTRAVENOUS

## 2023-10-21 MED ORDER — PROCHLORPERAZINE EDISYLATE 10 MG/2ML IJ SOLN
5.0000 mg | Freq: Once | INTRAMUSCULAR | Status: AC
  Administered 2023-10-21: 5 mg via INTRAVENOUS
  Filled 2023-10-21: qty 2

## 2023-10-21 MED ORDER — DIPHENHYDRAMINE HCL 50 MG/ML IJ SOLN
25.0000 mg | Freq: Once | INTRAMUSCULAR | Status: AC
  Administered 2023-10-21: 25 mg via INTRAVENOUS
  Filled 2023-10-21: qty 1

## 2023-10-21 NOTE — Discharge Instructions (Addendum)
Your creatinine was 2.66, your BUN was 40, please let your doctor know this.  You should be seen at [facility] health clinic, call in the morning for an appointment  You need to have repeat lab work done in 3 days, drink plenty of clear liquids and start to reduce the amount of alcohol that you drink.  Do not go cold [place].  Thank you for allowing [place] to treat you in the emergency department today.  After reviewing your examination and potential testing that was done it appears that you are safe to go home.  I would like for you to follow-up with your doctor within the next several days, have them obtain your records and follow-up with them to review all potential tests and results from your visit.  If you should develop severe or worsening symptoms return to the emergency department immediately

## 2023-10-21 NOTE — ED Provider Notes (Cosign Needed)
[place] EMERGENCY DEPARTMENT AT [facility] Provider Note   CSN: [id number] Arrival date & time: 10/21/23  1708     History  Chief Complaint  Patient presents with   Migraine    Jon Osborne is a 63 y.o. male.  Patient is a 63 year old male with a past medical history of hypertension, stage III kidney disease, small cell lung cancer (currently in remission) who presents to Emergency Department with a chief complaint of headache and generalized weakness.  Patient notes that the headache began at approximately 1600 today.  Patient denies any history of similar symptoms in the past.  He notes that he did have some associated blurry vision.  He denies any recent falls or blunt head trauma.  He notes that there has been no associated numbness, paresthesias or any lateral weakness.  He denies any associated dizziness, lightheadedness or syncope.  There is been no associated chest pain, shortness of breath, abdominal pain, nausea, vomiting, diarrhea.  Patient does admit to continued alcohol use.  He does note that his primary care doctor recently increased his losartan from 50 mg to 100 mg a day but this was 1 month ago.   Migraine Associated symptoms include headaches.       Home Medications Prior to Admission medications   Medication Sig Start Date End Date Taking? Authorizing Provider  colchicine 0.6 MG tablet Take 0.6 mg by mouth daily. 02/22/23  Yes [provider]  hydrochlorothiazide (HYDRODIURIL) 25 MG tablet Take 25 mg by mouth daily. 05/01/23  Yes [provider]  indomethacin (INDOCIN) 25 MG capsule Take 25 mg by mouth 2 (two) times daily with a meal. 02/22/23  Yes [provider]  losartan (COZAAR) 50 MG tablet Take 50 mg by mouth daily. 05/01/23  Yes [provider]  acetaminophen (TYLENOL) 325 MG tablet Take 2 tablets (650 mg total) by mouth every 6 (six) hours as needed for mild pain or headache (or Fever >/= 101). 01/30/21   Vassie Loll, MD  amLODipine (NORVASC) 10 MG tablet Take 10 mg by mouth daily. 08/03/21   [provider]  folic acid (FOLVITE) 1 MG tablet Take 1 tablet (1 mg total) by mouth daily. 01/31/21   Vassie Loll, MD  Multiple Vitamin (MULTIVITAMIN WITH MINERALS) TABS tablet Take 1 tablet by mouth daily. 01/31/21   Vassie Loll, MD  ondansetron (ZOFRAN ODT) 8 MG disintegrating tablet Take 1 tablet (8 mg total) by mouth every 8 (eight) hours as needed for nausea or vomiting. 01/30/21   Vassie Loll, MD  pantoprazole (PROTONIX) 40 MG tablet Take 1 tablet (40 mg total) by mouth daily. 01/31/21   Vassie Loll, MD  vitamin B-12 1000 MCG tablet Take 1 tablet (1,000 mcg total) by mouth daily. 01/31/21   Vassie Loll, MD      Allergies    Patient has no known allergies.    Review of Systems   Review of Systems  Neurological:  Positive for weakness and headaches.  All other systems reviewed and are negative.   Physical Exam Updated Vital Signs BP 111/72   Pulse 90   Temp 99.4 F (37.4 C) (Oral)   Resp 16   Ht 6\' 2"  (1.88 m)   Wt 83.9 kg   SpO2 94%   BMI 23.75 kg/m  Physical Exam Vitals reviewed.  Constitutional:      Appearance: Normal appearance.  HENT:     Head: Normocephalic and atraumatic.     Nose: Nose normal.  Mouth/Throat:     Mouth: Mucous membranes are moist.  Eyes:     Extraocular Movements: Extraocular movements intact.     Conjunctiva/sclera: Conjunctivae normal.     Pupils: Pupils are equal, round, and reactive to light.  Cardiovascular:     Rate and Rhythm: Normal rate and regular rhythm.     Pulses: Normal pulses.     Heart sounds: Normal heart sounds.  Pulmonary:     Effort: Pulmonary effort is normal. No respiratory distress.     Breath sounds: Normal breath sounds. No stridor. No wheezing or rales.  Abdominal:     General: Abdomen is flat. Bowel sounds are normal. There is no distension.     Palpations: Abdomen is soft. There is no mass.      Tenderness: There is no abdominal tenderness. There is no guarding.  Musculoskeletal:        General: No swelling, tenderness, deformity or signs of injury. Normal range of motion.     Cervical back: Normal range of motion and neck supple. No rigidity or tenderness.  Skin:    General: Skin is warm and dry.  Neurological:     General: No focal deficit present.     Mental Status: He is alert and oriented to person, place, and time. Mental status is at baseline.     Cranial Nerves: No cranial nerve deficit.     Sensory: No sensory deficit.     Motor: No weakness.     Coordination: Coordination normal.     Gait: Gait normal.     Comments: Anger to nose intact, normal rapid alternating movements, normal heel-to-shin  Psychiatric:        Mood and Affect: Mood normal.        Behavior: Behavior normal.        Thought Content: Thought content normal.        Judgment: Judgment normal.     ED Results / Procedures / Treatments   Labs (all labs ordered are listed, but only abnormal results are displayed) Labs Reviewed  CBC WITH DIFFERENTIAL/PLATELET - Abnormal; Notable for the following components:      Result Value   RBC 3.51 (*)    Hemoglobin 11.8 (*)    HCT 35.9 (*)    MCV 102.3 (*)    nRBC 2.1 (*)    Monocytes Absolute 1.5 (*)    All other components within normal limits  BASIC METABOLIC PANEL - Abnormal; Notable for the following components:   CO2 20 (*)    BUN 41 (*)    Creatinine, Ser 2.66 (*)    GFR, Estimated 26 (*)    All other components within normal limits  RESP PANEL BY RT-PCR (RSV, FLU A&B, COVID)  RVPGX2  MAGNESIUM  VITAMIN B12  FOLATE    EKG None  Radiology CT Head Wo Contrast Result Date: 10/21/2023 CLINICAL DATA:  Sudden severe headache.  Brain fog. EXAM: CT HEAD WITHOUT CONTRAST TECHNIQUE: Contiguous axial images were obtained from the base of the skull through the vertex without intravenous contrast. RADIATION DOSE REDUCTION: This exam was performed  according to the departmental dose-optimization program which includes automated exposure control, adjustment of the mA and/or kV according to patient size and/or use of iterative reconstruction technique. COMPARISON:  None Available. FINDINGS: Brain: The brain shows a normal appearance without evidence of malformation, atrophy, old or acute small or large vessel infarction, mass lesion, hemorrhage, hydrocephalus or extra-axial collection. Vascular: No hyperdense vessel. No evidence of atherosclerotic  calcification. Skull: Normal.  No traumatic finding.  No focal bone lesion. Sinuses/Orbits: Sinuses are clear. Orbits appear normal. Mastoids are clear. Other: None significant IMPRESSION: Normal head CT. Electronically Signed   By: Paulina Fusi M.D.   On: 10/21/2023 21:10    Procedures Procedures    Medications Ordered in ED Medications  sodium chloride 0.9 % bolus 1,000 mL (1,000 mLs Intravenous Bolus 10/21/23 2059)  prochlorperazine (COMPAZINE) injection 5 mg (5 mg Intravenous Given 10/21/23 2059)  diphenhydrAMINE (BENADRYL) injection 25 mg (25 mg Intravenous Given 10/21/23 2059)    ED Course/ Medical Decision Making/ A&P                                 Medical Decision Making Patient is doing much better at this time and is stable for discharge home.  Patient's vital signs have greatly improved with treatment in the emergency department he has had no further hypotension.  Patient notes his headache has completely resolved.  CT scan of the head was obtained which demonstrated no acute intracranial pathology or hemorrhage.  CT was obtained within 6 hours of onset.  Do not suspect subarachnoid hemorrhage, space-occupying lesion, meningitis.  Patient does have an acute on chronic kidney injury at this point.  Addendum physician did discuss with patient about the possibility of admission at this point for continued IV hydration but patient has declined at this time noting that he would like to follow-up  with his primary care doctor on an outpatient basis.  Patient had no other acute changes to include electrolyte derangements.  Patient has no indication for CVA or TIA.  The importance of close follow-up with his primary care doctor was discussed as well as strict turn precautions for any new or worsening symptoms.  Patient voiced understanding had no additional questions.  Amount and/or Complexity of Data Reviewed Labs: ordered. Radiology: ordered.  Risk Prescription drug management.           Final Clinical Impression(s) / ED Diagnoses Final diagnoses:  Bad headache  Acute kidney injury [facility])  Dehydration    Rx / DC Orders ED Discharge Orders     None         Lelon Perla, PA-C 10/21/23 2208

## 2023-10-21 NOTE — ED Triage Notes (Signed)
Pt arrived via POV c/o migraine and feeling "foggy brained". Pt reports recent changes to his medication.

## 2023-10-21 NOTE — ED Notes (Signed)
Orthostatic vitals completed and documented.

## 2023-10-21 NOTE — ED Provider Notes (Signed)
This patient is a 63 year old male heavy alcohol use presenting with headaches and blurry vision which is intermittent and not present at this time, on arrival the patient was slightly hypotensive, he reports that he has been on losartan and had his increased dose from 50 to 100 mg about a month ago.  He has been drinking heavy amounts of alcohol as he usually does.  On my exam he has no changes in vision, no headache, soft nontender abdomen, clear lung sounds, clear heart sounds, he is mentating appropriately, he has no asterixis and good neurologic function, I have offered him admission to the hospital because of his renal insufficiency but the patient refuses and states he rather go home and continue to drink fluids.  He was given 1 L of IV fluids here prior to discharge.  He has his medical decision making capacity and both he and his significant other were informed of the need for close follow-up and repeat lab work this week.  They are agreeable.  The patient refuses to be admitted to the hospital but is very willing to follow-up in the outpatient setting.  He does not appear hemodynamically unstable nor does appear septic   Eber Hong, MD 10/21/23 2201

## 2023-10-22 LAB — FOLATE: Folate: >40 ng/mL (ref >5.9)
